# Patient Record
Sex: Female | Born: 1975
Health system: Southern US, Community
[De-identification: ages and names within clinical notes are randomized; demographics above are authoritative.]

## PROBLEM LIST (undated history)

## (undated) DIAGNOSIS — D1800 Hemangioma unspecified site: Secondary | ICD-10-CM

## (undated) DIAGNOSIS — Q2112 Patent foramen ovale: Secondary | ICD-10-CM

## (undated) DIAGNOSIS — T7840XA Allergy, unspecified, initial encounter: Secondary | ICD-10-CM

## (undated) HISTORY — PX: MOUTH SURGERY: SHX715

## (undated) HISTORY — DX: Allergy, unspecified, initial encounter: T78.40XA

## (undated) HISTORY — DX: Hemangioma unspecified site: D18.00

## (undated) HISTORY — DX: Patent foramen ovale: Q21.12

---

## 2006-02-15 ENCOUNTER — Encounter: Admission: RE | Admit: 2006-02-15 | Discharge: 2006-02-15 | Payer: Self-pay | Admitting: Interventional Radiology

## 2006-03-03 ENCOUNTER — Emergency Department (HOSPITAL_COMMUNITY): Admission: EM | Admit: 2006-03-03 | Discharge: 2006-03-03 | Payer: Self-pay | Admitting: Emergency Medicine

## 2008-05-09 ENCOUNTER — Inpatient Hospital Stay (HOSPITAL_COMMUNITY): Admission: AD | Admit: 2008-05-09 | Discharge: 2008-05-12 | Payer: Self-pay | Admitting: Obstetrics and Gynecology

## 2010-09-10 ENCOUNTER — Ambulatory Visit (HOSPITAL_COMMUNITY)
Admission: RE | Admit: 2010-09-10 | Discharge: 2010-09-10 | Payer: Self-pay | Source: Home / Self Care | Attending: Obstetrics and Gynecology | Admitting: Obstetrics and Gynecology

## 2010-09-13 LAB — CBC
HCT: 41.4 % (ref 36.0–46.0)
Hemoglobin: 14.2 g/dL (ref 12.0–15.0)
MCH: 29.8 pg (ref 26.0–34.0)
MCHC: 34.3 g/dL (ref 30.0–36.0)
MCV: 86.8 fL (ref 78.0–100.0)
Platelets: 227 10*3/uL (ref 150–400)
RBC: 4.77 MIL/uL (ref 3.87–5.11)
RDW: 12.5 % (ref 11.5–15.5)
WBC: 4.3 10*3/uL (ref 4.0–10.5)

## 2010-09-17 NOTE — Op Note (Signed)
  NAME:  Linda Wolfe, Linda Wolfe              ACCOUNT NO.:  [id number]  MEDICAL RECORD NO.:  [id number]          PATIENT TYPE:  AMB  LOCATION:  SDC                           FACILITY:  [facility]  PHYSICIAN:  Dineen Kid. Rana Snare, M.D.    DATE OF BIRTH:  09-17-1975  DATE OF PROCEDURE:  09/10/2010 DATE OF DISCHARGE:                              OPERATIVE REPORT   PREOPERATIVE DIAGNOSIS:  Embryonic demise at 59 weeks' gestational age.  POSTOPERATIVE DIAGNOSIS:  Embryonic demise at 69 weeks' gestational age.  PROCEDURE:  Dilation and evacuation.  SURGEON:  Dineen Kid. Rana Snare, MD  ANESTHESIA:  Monitored anesthetic care and paracervical block.  INDICATIONS:  [name] is a 35 year old, G3, P1, who presented to the office last week with size less than dates gestational sac.  Followup ultrasound yesterday shows embryonic demise 8-week size.  She desires dilation and evacuation.  Blood type is A positive.  Risks and benefits were discussed.  Informed consent was obtained.  DESCRIPTION OF PROCEDURE:  After adequate analgesia, the patient placed in the dorsal lithotomy position.  She was sterilely prepped and draped. The bladder was sterilely drained.  Graves speculum was placed. Tenaculum placed in the anterior lip of the cervix.  Paracervical block was placed with 1% Xylocaine 1:100,000 epinephrine, total of 20 mL used. Uterus sounded to 9-cm, easily dilated to #29 [facility] dilator.  A 8-mm suction curette was inserted.  Suction curettage were performed retrieving products of conception.  This was performed to the gritty surface and felt throughout the endometrial cavity.  The patient was given Methergine 0.2 mg IM with good uterine response.  No further products are being retrieved.  The curette was then removed.  Tenaculum removed from the anterior lip of the cervix.  The cervix noted to be hemostatic.  The patient was then transferred to the recovery room in stable condition.  The patient received 1 g of Ancef  preoperatively, 0.2 mg of Methergine intraoperatively and Toradol 30 mg IV postoperatively.  DISPOSITION:  The patient will be discharged to home and will follow up in the office in 2-3 weeks.  Sent home with routine instruction sheet for D and E.  Told to return for increased pain, fever or bleeding.  She does sign up the prescription for Methergine 0.2 mg to take every 8 h. for 2 days.     Dineen Kid Rana Snare, M.D.    DCL/MEDQ  D:  09/10/2010  T:  09/11/2010  Job:  [id number]  Electronically Signed by Candice Camp M.D. on 09/16/2010 11:15:04 PM

## 2011-01-11 NOTE — Op Note (Signed)
NAME:  Linda Wolfe, Linda Wolfe              ACCOUNT NO.:  [id number]   MEDICAL RECORD NO.:  [id number]          PATIENT TYPE:  INP   LOCATION:  9117                          FACILITY:  [facility]   PHYSICIAN:  Dineen Kid. Rana Snare, M.D.    DATE OF BIRTH:  Nov 11, 1975   DATE OF PROCEDURE:  05/09/2008  DATE OF DISCHARGE:                               OPERATIVE REPORT   PREOPERATIVE DIAGNOSES:  Intrauterine pregnancy at 36-1/2 weeks'  gestational age, breech labor, and nonreassuring fetal heart tracing.   POSTOPERATIVE DIAGNOSES:  Intrauterine pregnancy at 36-1/2 weeks'  gestational age, breech labor, and nonreassuring fetal heart tracing.   PROCEDURE:  Primary low-segment transverse cesarean section.   SURGEON:  Dineen Kid. Rana Snare, MD   ANESTHESIA:  Spinal.   INDICATIONS:  [name] is a 35 year old G1 who presented to the  hospital with ongoing abdominal pelvic pain and contractions, had a  difficult night last night.  Because of contractions, she has known  breech presentation.  She also has a question of rupture of membranes.  On physical exam, we had noted that she was not ruptured.  She was  completely effaced, 1 cm dilated with a -1 station of the buttocks.  While monitoring, she was contracting every 2 to 4 minutes.  She did  have a prolonged heart rate deceleration to 80 beats a minute for about  a minute and half, which responded a position change and fluids.  Because of early labor and also the D cell, we planned to proceed with  primary low-segment transverse cesarean section.  The risks and benefits  were discussed and informed consent was obtained.   FINDINGS:  At the time of surgery, viable female infant, Apgars were 9  and 9, pH arterial 7.30, and the weight was 6 pounds 0 ounces.   DESCRIPTION OF PROCEDURE:  After adequate analgesia, the patient was  placed in the supine position with left lateral tilt.  She was sterilely  prepped and draped.  The bladder was sterilely drained with a  Foley  catheter.  A Pfannenstiel skin incision was made 2 fingerbreadths above  the pubic symphysis, which was taken down sharply to fascia and incised  transversely, extended superiorly and inferiorly off the bellies of  rectus muscle, which were separated sharply in midline.  Peritoneum was  entered sharply.  Bladder flap created and were placed behind the  bladder blade.  A low-segment myotomy incision was made down to the  amniotic sac and extended laterally with an operator's fingertips.  The  infant's buttocks was delivered atraumatically.  The legs were reduced  and arms were easily reduced.  The head was delivered atraumatically.  The nares and pharynx were then suctioned.  Cord clamped, cut, and  handed to the pediatricians for resuscitation.  Cord blood was then  obtained.  Placenta was extracted manually.  The uterus was exteriorized  and wiped clean with a dry lap.  The myotomy incision was closed in 2  layers, first being a running locking layer and the second being the  imbricating layer of 0 Monocryl suture.  The uterus was  then placed back  in the peritoneal cavity, and after copious amount of irrigation and  adequate hemostasis was assured, the peritoneum was closed with 0  Monocryl suture.  Rectus muscle was plicated in midline.  The fascia  was then closed with a #1 Vicryl in running fashion.  Irrigation was  applied.  After adequate hemostasis, skin staples and Steri-Strips were  applied.  The patient tolerated the procedure well, was stable on  transfer to recovery room.  Sponge and instrument counts were normal x3.  Estimated blood loss was 500 mL.      Dineen Kid Rana Snare, M.D.  Electronically Signed     DCL/MEDQ  D:  05/09/2008  T:  05/10/2008  Job:  [id number]

## 2011-01-11 NOTE — Discharge Summary (Signed)
Linda Wolfe, WHITMORE NO.:  [id number]   MEDICAL RECORD NO.:  [id number]          PATIENT TYPE:  INP   LOCATION:  9117                          [facility]ILITY:  [facility]   PHYSICIAN:  Zelphia Cairo, MD    DATE OF BIRTH:  [birth year]   DATE OF ADMISSION:  05/09/2008  DATE OF DISCHARGE:  05/12/2008                               DISCHARGE SUMMARY   ADMITTING DIAGNOSES:  1. Intrauterine pregnancy at 36-1/2 weeks' estimated gestational age.  2. Spontaneous onset of labor.  3. Breech presentation.   DISCHARGE DIAGNOSES:  1. Status post low transverse cesarean section.  2. Viable female infant.   PROCEDURE:  Primary low transverse cesarean section.   REASON FOR ADMISSION:  Please see dictated H&P.   HOSPITAL COURSE:  The patient is a 35 year old primigravida who was  admitted to [facility] at 36-1/2 weeks' estimated  gestational age with spontaneous early onset of labor.  While the  patient was being observed, the patient was also noted to have a  prolonged deceleration down to 80 beats per minute with spontaneous  recovery.  The patient was also known to have a breech presentation.  Due to the non-reassuring fetal heart tones and breech presentation,  decision was made to proceed with a primary low transverse cesarean  section.  The patient was then transferred to the operating room where  spinal anesthesia was administered without difficulty.  A low transverse  incision was made, with delivery of a viable female infant with Apgars  of  9 at one minute and 9 at five minutes.  Arterial cord pH was 7.30.  The patient tolerated the procedure well and was taken to the recovery  room in stable condition.  On postoperative day #1, the patient was  without complaint.  Vital signs were stable.  She was afebrile.  Abdomen  was soft.  Fundus firm and nontender.  Abdominal dressing was noted to  be clean, dry, and intact.  Laboratory findings showed hemoglobin of  10.6.  On postoperative day #2, the patient was without complaint.  Vital signs were stable.  She was afebrile.  Fundus firm and nontender.  Abdominal dressing had been removed revealing an incision that was  clean, dry, and intact.  On postoperative day #3, the patient continued  to be without complaint.  Vital signs were stable.  She was afebrile.  Fundus firm and nontender.  Incision was clean, dry, and intact.  The  staples were removed.  Discharge instructions were reviewed, and the  patient was later discharged home.   CONDITION ON DISCHARGE:  Stable.   DIET:  Regular as tolerated.   ACTIVITY:  No heavy lifting, no driving x2 weeks, and no vaginal entry.   FOLLOWUP:  The patient is to follow up in the office in 1-2 weeks for an  incision check.  She is to call for temperature greater than 100  degrees, persistent nausea, vomiting, heavy vaginal bleeding, and/or  redness or drainage from the incisional site.   DISCHARGE MEDICATIONS:  1. Percocet 5/325, #30, 1 p.o. q.4-6 h. p.r.n.  2. Motrin 600  mg every 6 hours.  3. Prenatal vitamins 1 p.o. daily.  4. Colace 1 p.o. daily p.r.n.      Julio Sicks, N.P.      Zelphia Cairo, MD  Electronically Signed    CC/MEDQ  D:  05/12/2008  T:  05/12/2008  Job:  [id number]

## 2011-01-25 LAB — ABO/RH: RH Type: POSITIVE

## 2011-01-25 LAB — HEPATITIS B SURFACE ANTIGEN: Hepatitis B Surface Ag: NEGATIVE

## 2011-06-01 LAB — RPR: RPR Ser Ql: NONREACTIVE

## 2011-06-01 LAB — CBC
HCT: 31.4 — ABNORMAL LOW
HCT: 38.4
Hemoglobin: 10.6 — ABNORMAL LOW
Hemoglobin: 13.1
MCHC: 33.7
MCHC: 34
MCV: 90
MCV: 91.8
Platelets: 191
Platelets: 234
RBC: 3.42 — ABNORMAL LOW
RBC: 4.27
RDW: 13.2
RDW: 13.4
WBC: 11 — ABNORMAL HIGH
WBC: 9.2

## 2011-07-16 ENCOUNTER — Encounter (HOSPITAL_COMMUNITY): Payer: Self-pay | Admitting: Anesthesiology

## 2011-07-16 ENCOUNTER — Encounter (HOSPITAL_COMMUNITY): Payer: Self-pay | Admitting: *Deleted

## 2011-07-16 ENCOUNTER — Other Ambulatory Visit: Payer: Self-pay | Admitting: Obstetrics and Gynecology

## 2011-07-16 ENCOUNTER — Inpatient Hospital Stay (HOSPITAL_COMMUNITY)
Admission: AD | Admit: 2011-07-16 | Discharge: 2011-07-18 | DRG: 371 | Disposition: A | Discharge status: Home / Self Care | Payer: BC Managed Care – PPO | Source: Ambulatory Visit | Attending: Obstetrics and Gynecology | Admitting: Obstetrics and Gynecology

## 2011-07-16 ENCOUNTER — Encounter (HOSPITAL_COMMUNITY)
Admission: AD | Disposition: A | Discharge status: Home / Self Care | Payer: Self-pay | Source: Ambulatory Visit | Attending: Obstetrics and Gynecology

## 2011-07-16 ENCOUNTER — Inpatient Hospital Stay (HOSPITAL_COMMUNITY): Payer: BC Managed Care – PPO | Admitting: Anesthesiology

## 2011-07-16 DIAGNOSIS — Z302 Encounter for sterilization: Secondary | ICD-10-CM

## 2011-07-16 DIAGNOSIS — O34219 Maternal care for unspecified type scar from previous cesarean delivery: Principal | ICD-10-CM | POA: Diagnosis present

## 2011-07-16 DIAGNOSIS — O09529 Supervision of elderly multigravida, unspecified trimester: Secondary | ICD-10-CM | POA: Diagnosis present

## 2011-07-16 LAB — CBC
HCT: 37.3 % (ref 36.0–46.0)
Hemoglobin: 12.7 g/dL (ref 12.0–15.0)
MCH: 30.8 pg (ref 26.0–34.0)
MCHC: 34 g/dL (ref 30.0–36.0)
MCV: 90.3 fL (ref 78.0–100.0)
Platelets: 175 10*3/uL (ref 150–400)
RBC: 4.13 MIL/uL (ref 3.87–5.11)
RDW: 13.4 % (ref 11.5–15.5)
WBC: 8.8 10*3/uL (ref 4.0–10.5)

## 2011-07-16 LAB — RPR: RPR Ser Ql: NONREACTIVE

## 2011-07-16 LAB — POCT FERN TEST: Fern Test: POSITIVE

## 2011-07-16 SURGERY — Surgical Case
Anesthesia: Regional | Site: Abdomen | Wound class: Clean Contaminated

## 2011-07-16 MED ORDER — FLEET ENEMA 7-19 GM/118ML RE ENEM: 1.0000 | ENEMA | RECTAL | Status: DC | PRN

## 2011-07-16 MED ORDER — CITRIC ACID-SODIUM CITRATE 334-500 MG/5ML PO SOLN: 30.0000 mL | Freq: Once | ORAL | Status: DC

## 2011-07-16 MED ORDER — CITRIC ACID-SODIUM CITRATE 334-500 MG/5ML PO SOLN
ORAL | Status: AC
  Administered 2011-07-16: 30 mL via ORAL
  Filled 2011-07-16: qty 15

## 2011-07-16 MED ORDER — ONDANSETRON HCL 4 MG/2ML IJ SOLN: 4.0000 mg | INTRAMUSCULAR | Status: DC | PRN

## 2011-07-16 MED ORDER — LACTATED RINGERS IV SOLN
INTRAVENOUS | Status: DC
  Administered 2011-07-16: 100 mL via INTRAVENOUS
  Administered 2011-07-16 (×2): via INTRAVENOUS

## 2011-07-16 MED ORDER — KETOROLAC TROMETHAMINE 30 MG/ML IJ SOLN
30.0000 mg | Freq: Four times a day (QID) | INTRAMUSCULAR | Status: AC | PRN
  Administered 2011-07-16 – 2011-07-17 (×3): 30 mg via INTRAVENOUS
  Filled 2011-07-16 (×2): qty 1

## 2011-07-16 MED ORDER — EPHEDRINE 5 MG/ML INJ
INTRAVENOUS | Status: AC
  Filled 2011-07-16: qty 10

## 2011-07-16 MED ORDER — LANOLIN HYDROUS EX OINT: 1.0000 "application " | TOPICAL_OINTMENT | CUTANEOUS | Status: DC | PRN

## 2011-07-16 MED ORDER — KETOROLAC TROMETHAMINE 30 MG/ML IJ SOLN
INTRAMUSCULAR | Status: AC
  Administered 2011-07-16: 30 mg via INTRAVENOUS
  Filled 2011-07-16: qty 1

## 2011-07-16 MED ORDER — SENNOSIDES-DOCUSATE SODIUM 8.6-50 MG PO TABS
2.0000 | ORAL_TABLET | Freq: Every day | ORAL | Status: DC
  Administered 2011-07-17: 2 via ORAL

## 2011-07-16 MED ORDER — FENTANYL CITRATE 0.05 MG/ML IJ SOLN: 25.0000 ug | INTRAMUSCULAR | Status: DC | PRN

## 2011-07-16 MED ORDER — OXYTOCIN 10 UNIT/ML IJ SOLN
INTRAMUSCULAR | Status: DC | PRN
  Administered 2011-07-16: 20 [IU] via INTRAMUSCULAR

## 2011-07-16 MED ORDER — ZOLPIDEM TARTRATE 5 MG PO TABS: 5.0000 mg | ORAL_TABLET | Freq: Every evening | ORAL | Status: DC | PRN

## 2011-07-16 MED ORDER — ONDANSETRON HCL 4 MG/2ML IJ SOLN
INTRAMUSCULAR | Status: DC | PRN
  Administered 2011-07-16: 4 mg via INTRAVENOUS

## 2011-07-16 MED ORDER — FENTANYL CITRATE 0.05 MG/ML IJ SOLN
INTRAMUSCULAR | Status: AC
  Filled 2011-07-16: qty 2

## 2011-07-16 MED ORDER — DIPHENHYDRAMINE HCL 50 MG/ML IJ SOLN: 25.0000 mg | INTRAMUSCULAR | Status: DC | PRN

## 2011-07-16 MED ORDER — OXYTOCIN 20 UNITS IN LACTATED RINGERS INFUSION - SIMPLE
125.0000 mL/h | INTRAVENOUS | Status: AC
  Administered 2011-07-16: 125 mL/h via INTRAVENOUS

## 2011-07-16 MED ORDER — SIMETHICONE 80 MG PO CHEW: 80.0000 mg | CHEWABLE_TABLET | ORAL | Status: DC | PRN

## 2011-07-16 MED ORDER — DIPHENHYDRAMINE HCL 50 MG/ML IJ SOLN: 12.5000 mg | INTRAMUSCULAR | Status: DC | PRN

## 2011-07-16 MED ORDER — DIPHENHYDRAMINE HCL 25 MG PO CAPS: 25.0000 mg | ORAL_CAPSULE | Freq: Four times a day (QID) | ORAL | Status: DC | PRN

## 2011-07-16 MED ORDER — FENTANYL CITRATE 0.05 MG/ML IJ SOLN
INTRAMUSCULAR | Status: DC | PRN
  Administered 2011-07-16: 20 ug via INTRATHECAL

## 2011-07-16 MED ORDER — BUPIVACAINE IN DEXTROSE 0.75-8.25 % IT SOLN
INTRATHECAL | Status: DC | PRN
  Administered 2011-07-16: 2 mL via INTRATHECAL

## 2011-07-16 MED ORDER — SODIUM CHLORIDE 0.9 % IJ SOLN: 3.0000 mL | INTRAMUSCULAR | Status: DC | PRN

## 2011-07-16 MED ORDER — MORPHINE SULFATE 0.5 MG/ML IJ SOLN
INTRAMUSCULAR | Status: AC
  Filled 2011-07-16: qty 10

## 2011-07-16 MED ORDER — NALBUPHINE HCL 10 MG/ML IJ SOLN: 5.0000 mg | INTRAMUSCULAR | Status: DC | PRN

## 2011-07-16 MED ORDER — MEASLES, MUMPS & RUBELLA VAC ~~LOC~~ INJ: 0.5000 mL | INJECTION | Freq: Once | SUBCUTANEOUS | Status: DC

## 2011-07-16 MED ORDER — PRENATAL PLUS 27-1 MG PO TABS
1.0000 | ORAL_TABLET | Freq: Every day | ORAL | Status: DC
  Administered 2011-07-17 (×2): 1 via ORAL
  Filled 2011-07-16: qty 1

## 2011-07-16 MED ORDER — CEFAZOLIN SODIUM 1-5 GM-% IV SOLN
INTRAVENOUS | Status: AC
  Administered 2011-07-16: 1 g via INTRAVENOUS
  Filled 2011-07-16: qty 50

## 2011-07-16 MED ORDER — SODIUM CHLORIDE 0.9 % IJ SOLN: 3.0000 mL | Freq: Two times a day (BID) | INTRAMUSCULAR | Status: DC

## 2011-07-16 MED ORDER — IBUPROFEN 800 MG PO TABS
800.0000 mg | ORAL_TABLET | Freq: Three times a day (TID) | ORAL | Status: DC | PRN
  Administered 2011-07-17 – 2011-07-18 (×4): 800 mg via ORAL
  Filled 2011-07-16 (×5): qty 1

## 2011-07-16 MED ORDER — TETANUS-DIPHTH-ACELL PERTUSSIS 5-2.5-18.5 LF-MCG/0.5 IM SUSP: 0.5000 mL | Freq: Once | INTRAMUSCULAR | Status: DC

## 2011-07-16 MED ORDER — WITCH HAZEL-GLYCERIN EX PADS: 1.0000 "application " | MEDICATED_PAD | CUTANEOUS | Status: DC | PRN

## 2011-07-16 MED ORDER — METOCLOPRAMIDE HCL 5 MG/ML IJ SOLN
10.0000 mg | Freq: Once | INTRAMUSCULAR | Status: AC | PRN
  Administered 2011-07-16: 10 mg via INTRAVENOUS

## 2011-07-16 MED ORDER — MORPHINE SULFATE (PF) 0.5 MG/ML IJ SOLN
INTRAMUSCULAR | Status: DC | PRN
  Administered 2011-07-16: .1 mg via INTRATHECAL

## 2011-07-16 MED ORDER — SCOPOLAMINE 1 MG/3DAYS TD PT72
MEDICATED_PATCH | TRANSDERMAL | Status: AC
  Administered 2011-07-16: 1 via TRANSDERMAL
  Filled 2011-07-16: qty 1

## 2011-07-16 MED ORDER — METOCLOPRAMIDE HCL 5 MG/ML IJ SOLN
INTRAMUSCULAR | Status: AC
  Administered 2011-07-16: 10 mg via INTRAVENOUS
  Filled 2011-07-16: qty 2

## 2011-07-16 MED ORDER — CEFAZOLIN SODIUM 1-5 GM-% IV SOLN
1.0000 g | INTRAVENOUS | Status: AC
  Administered 2011-07-16: 1 g via INTRAVENOUS

## 2011-07-16 MED ORDER — SIMETHICONE 80 MG PO CHEW
80.0000 mg | CHEWABLE_TABLET | Freq: Three times a day (TID) | ORAL | Status: DC
  Administered 2011-07-17 (×2): 80 mg via ORAL

## 2011-07-16 MED ORDER — MEPERIDINE HCL 25 MG/ML IJ SOLN: 6.2500 mg | INTRAMUSCULAR | Status: DC | PRN

## 2011-07-16 MED ORDER — SODIUM CHLORIDE 0.9 % IR SOLN
Status: DC | PRN
  Administered 2011-07-16: 1000 mL

## 2011-07-16 MED ORDER — NALOXONE HCL 0.4 MG/ML IJ SOLN: 0.4000 mg | INTRAMUSCULAR | Status: DC | PRN

## 2011-07-16 MED ORDER — FAMOTIDINE IN NACL 20-0.9 MG/50ML-% IV SOLN: 20.0000 mg | Freq: Once | INTRAVENOUS | Status: DC

## 2011-07-16 MED ORDER — CITRIC ACID-SODIUM CITRATE 334-500 MG/5ML PO SOLN
30.0000 mL | Freq: Once | ORAL | Status: AC
  Administered 2011-07-16: 30 mL via ORAL

## 2011-07-16 MED ORDER — SODIUM CHLORIDE 0.9 % IV SOLN: 1.0000 ug/kg/h | INTRAVENOUS | Status: DC | PRN

## 2011-07-16 MED ORDER — MENTHOL 3 MG MT LOZG: 1.0000 | LOZENGE | OROMUCOSAL | Status: DC | PRN

## 2011-07-16 MED ORDER — PROPRANOLOL HCL 10 MG PO TABS
10.0000 mg | ORAL_TABLET | Freq: Every day | ORAL | Status: DC
  Administered 2011-07-16: 10 mg via ORAL
  Administered 2011-07-17: 22:00:00 via ORAL
  Filled 2011-07-16 (×2): qty 1

## 2011-07-16 MED ORDER — SCOPOLAMINE 1 MG/3DAYS TD PT72: 1.0000 | MEDICATED_PATCH | TRANSDERMAL | Status: DC

## 2011-07-16 MED ORDER — ONDANSETRON HCL 4 MG/2ML IJ SOLN
INTRAMUSCULAR | Status: AC
  Filled 2011-07-16: qty 2

## 2011-07-16 MED ORDER — BISACODYL 10 MG RE SUPP: 10.0000 mg | Freq: Every day | RECTAL | Status: DC | PRN

## 2011-07-16 MED ORDER — SCOPOLAMINE 1 MG/3DAYS TD PT72: 1.0000 | MEDICATED_PATCH | Freq: Once | TRANSDERMAL | Status: DC

## 2011-07-16 MED ORDER — SODIUM CHLORIDE 0.9 % IV SOLN: 250.0000 mL | INTRAVENOUS | Status: DC

## 2011-07-16 MED ORDER — OXYTOCIN 20 UNITS IN LACTATED RINGERS INFUSION - SIMPLE
INTRAVENOUS | Status: AC
  Administered 2011-07-16: 125 mL/h via INTRAVENOUS
  Filled 2011-07-16: qty 1000

## 2011-07-16 MED ORDER — OXYTOCIN 10 UNIT/ML IJ SOLN
INTRAMUSCULAR | Status: AC
  Filled 2011-07-16: qty 2

## 2011-07-16 MED ORDER — DIPHENHYDRAMINE HCL 25 MG PO CAPS: 25.0000 mg | ORAL_CAPSULE | ORAL | Status: DC | PRN

## 2011-07-16 MED ORDER — KETOROLAC TROMETHAMINE 30 MG/ML IJ SOLN: 30.0000 mg | Freq: Four times a day (QID) | INTRAMUSCULAR | Status: AC | PRN

## 2011-07-16 MED ORDER — OXYCODONE-ACETAMINOPHEN 5-325 MG PO TABS: 1.0000 | ORAL_TABLET | Freq: Four times a day (QID) | ORAL | Status: DC | PRN

## 2011-07-16 MED ORDER — METOCLOPRAMIDE HCL 5 MG/ML IJ SOLN: 10.0000 mg | Freq: Three times a day (TID) | INTRAMUSCULAR | Status: DC | PRN

## 2011-07-16 MED ORDER — ONDANSETRON HCL 4 MG/2ML IJ SOLN: 4.0000 mg | Freq: Three times a day (TID) | INTRAMUSCULAR | Status: DC | PRN

## 2011-07-16 MED ORDER — IBUPROFEN 600 MG PO TABS: 600.0000 mg | ORAL_TABLET | Freq: Four times a day (QID) | ORAL | Status: DC | PRN

## 2011-07-16 MED ORDER — DIBUCAINE 1 % RE OINT: 1.0000 "application " | TOPICAL_OINTMENT | RECTAL | Status: DC | PRN

## 2011-07-16 SURGICAL SUPPLY — 26 items
CLIP FILSHIE TUBAL LIGA STRL (Clip) ×2 IMPLANT
CLOTH BEACON ORANGE TIMEOUT ST (SAFETY) ×2 IMPLANT
DRESSING TELFA 8X3 (GAUZE/BANDAGES/DRESSINGS) IMPLANT
DRSG COVADERM 4X10 (GAUZE/BANDAGES/DRESSINGS) ×2 IMPLANT
DRSG PAD ABDOMINAL 8X10 ST (GAUZE/BANDAGES/DRESSINGS) ×2 IMPLANT
ELECT REM PT RETURN 9FT ADLT (ELECTROSURGICAL) ×2
ELECTRODE REM PT RTRN 9FT ADLT (ELECTROSURGICAL) ×1 IMPLANT
EXTRACTOR VACUUM M CUP 4 TUBE (SUCTIONS) IMPLANT
GAUZE SPONGE 4X4 12PLY STRL LF (GAUZE/BANDAGES/DRESSINGS) ×2 IMPLANT
GLOVE BIO SURGEON STRL SZ7 (GLOVE) ×4 IMPLANT
GOWN PREVENTION PLUS LG XLONG (DISPOSABLE) ×4 IMPLANT
KIT ABG SYR 3ML LUER SLIP (SYRINGE) IMPLANT
NEEDLE HYPO 25X5/8 SAFETYGLIDE (NEEDLE) IMPLANT
NS IRRIG 1000ML POUR BTL (IV SOLUTION) ×2 IMPLANT
PACK C SECTION WH (CUSTOM PROCEDURE TRAY) ×2 IMPLANT
PAD ABD 7.5X8 STRL (GAUZE/BANDAGES/DRESSINGS) ×2 IMPLANT
SLEEVE SCD COMPRESS KNEE MED (MISCELLANEOUS) ×2 IMPLANT
SUT CHROMIC 0 CTX 36 (SUTURE) ×6 IMPLANT
SUT MON AB 4-0 PS1 27 (SUTURE) ×2 IMPLANT
SUT PDS AB 0 CT1 27 (SUTURE) ×4 IMPLANT
SUT VIC AB 3-0 CT1 27 (SUTURE) ×2
SUT VIC AB 3-0 CT1 TAPERPNT 27 (SUTURE) ×2 IMPLANT
TAPE CLOTH SURG 4X10 WHT LF (GAUZE/BANDAGES/DRESSINGS) ×2 IMPLANT
TOWEL OR 17X24 6PK STRL BLUE (TOWEL DISPOSABLE) ×4 IMPLANT
TRAY FOLEY CATH 14FR (SET/KITS/TRAYS/PACK) ×2 IMPLANT
WATER STERILE IRR 1000ML POUR (IV SOLUTION) ×2 IMPLANT

## 2011-07-16 NOTE — Anesthesia Procedure Notes (Addendum)
Spinal  Patient location during procedure: OR Start time: 07/16/2011 11:17 AM Staffing Anesthesiologist: FOSTER, MICHAEL A. Performed by: anesthesiologist  Preanesthetic Checklist Completed: patient identified, site marked, surgical consent, pre-op evaluation, timeout performed, IV checked, risks and benefits discussed and monitors and equipment checked Spinal Block Patient position: sitting Prep: site prepped and draped and DuraPrep Patient monitoring: heart rate, cardiac monitor, continuous pulse ox and blood pressure Approach: midline Location: L3-4 Injection technique: single-shot Needle Needle type: Sprotte  Needle gauge: 24 G Needle length: 9 cm Needle insertion depth: 4 cm Assessment Sensory level: T4 Additional Notes Patient tolerated procedure well. Sensory level adequate.

## 2011-07-16 NOTE — Progress Notes (Signed)
Pt reports having a  Heavy mucusy discharge since yesterday that has gotten more "water" like. Reports mild  occasional cramping.Marland Kitchen

## 2011-07-16 NOTE — Anesthesia Postprocedure Evaluation (Signed)
  Anesthesia Post-op Note  Patient: Linda Wolfe  Procedure(s) Performed:  CESAREAN SECTION - Repeat cesarean section with delivery of baby boy at 56. apgars 8/9. Bilateral tubal ligation with filshie clips.  Patient Location: PACU  Anesthesia Type: Spinal  Level of Consciousness: awake, alert  and oriented  Airway and Oxygen Therapy: Patient Spontanous Breathing  Post-op Pain: none  Post-op Assessment: Post-op Vital signs reviewed, Patient's Cardiovascular Status Stable, Respiratory Function Stable, Patent Airway, No signs of Nausea or vomiting, Pain level controlled, No headache and No backache  Post-op Vital Signs: Reviewed and stable  Complications: No apparent anesthesia complications

## 2011-07-16 NOTE — H&P (Signed)
NAVA SONG is a 35 y.o. female presenting for SROM + labor, sched for RCS, declines TOL. Maternal Medical History:  Reason for admission: Reason for admission: rupture of membranes and contractions.  Contractions: Onset was 3-5 hours ago.   Frequency: irregular.   Perceived severity is mild.    Fetal activity: Perceived fetal activity is normal.      OB History    Grav Para Term Preterm Abortions TAB SAB Ect Mult Living   4 1 0 1 2 0 2 0 0 1      Past Medical History  Diagnosis Date  . Migraine    Past Surgical History  Procedure Date  . Cesarean section   . Mouth surgery    Family History: family history is not on file. Social History:  reports that she has never smoked. She does not have any smokeless tobacco history on file. She reports that she does not drink alcohol or use illicit drugs.  ROS    Blood pressure 123/73, pulse 73, temperature 98.5 F (36.9 C), temperature source Oral, resp. rate 18, height 5' 9.5" (1.765 m), weight 74.299 kg (163 lb 12.8 oz). Maternal Exam:  Uterine Assessment: Contraction strength is mild.  Contraction frequency is regular.   Abdomen: Patient reports no abdominal tenderness. Surgical scars: low transverse.   Fundal height is 35 cm.   Estimated fetal weight is AGA.   Fetal presentation: vertex     Physical Exam  Constitutional: She appears well-developed and well-nourished.  HENT:  Head: Normocephalic and atraumatic.  Neck: Normal range of motion. Neck supple.  Cardiovascular: Normal rate and regular rhythm.   Respiratory: Effort normal and breath sounds normal.  GI:       35 cm FH, FHR 146  Genitourinary:       SSE>>+ pool, fern+ nitr+, cx ~ 1 cm    Prenatal labs: ABO, Rh:   Antibody:   Rubella:   RPR:    HBsAg:    HIV:    GBS:     Assessment/Plan: Prior CS @ 35 weeks w/ SROM + labor, declines VBAC.Risks of bleeding, infxn, transfusion reviewed.  Filshie tubal permanence + failure rate of 2-10/998 reviewed. Pt  + husband agree for [facility] 07/16/2011, 10:12 AM

## 2011-07-16 NOTE — Progress Notes (Signed)
Pt presents to MAU with chief complaint of ? ROM. Pt states she noticed a mucous discharge yesterday morning. Pt states throughout the night she noticed her panties more wet. Pt is a G2P1 at [redacted]w[redacted]d. First baby born at 22w6days. No complications, pt is a prior c-section, plans to have section with this baby as well.

## 2011-07-16 NOTE — ED Provider Notes (Signed)
History     Chief Complaint  Patient presents with  . Rupture of Membranes   HPI Linda Wolfe 35 y.o. 35w 0d gestation.  Comes to MAU with leaking of fluid.  Had mucus discharge yesterday and through the night continued to have leaking.  No contractions.  No bleeding.   OB History    Grav Para Term Preterm Abortions TAB SAB Ect Mult Living   4 1 0 1 2 0 2 0 0 1       Past Medical History  Diagnosis Date  . Migraine     Past Surgical History  Procedure Date  . Cesarean section   . Mouth surgery     No family history on file.  History  Substance Use Topics  . Smoking status: Never Smoker   . Smokeless tobacco: Not on file  . Alcohol Use: No    Allergies:  Allergies  Allergen Reactions  . Codeine Nausea And Vomiting    Prescriptions prior to admission  Medication Sig Dispense Refill  . acetaminophen (TYLENOL) 325 MG tablet Take 325 mg by mouth every 6 (six) hours as needed. For headaches.       . prenatal vitamin w/FE, FA (PRENATAL 1 + 1) 27-1 MG TABS Take 1 tablet by mouth daily.        . propranolol (INDERAL) 10 MG tablet Take 10 mg by mouth daily.          Review of Systems  Genitourinary:       Leaking of fluid   Physical Exam   Blood pressure 123/73, pulse 73, temperature 98.5 F (36.9 C), temperature source Oral, resp. rate 18, height 5' 9.5" (1.765 m), weight 163 lb 12.8 oz (74.299 kg).  Physical Exam  Nursing note and vitals reviewed. Constitutional: She is oriented to person, place, and time. She appears well-developed and well-nourished.  HENT:  Head: Normocephalic.  Eyes: EOM are normal.  Neck: Neck supple.  GI: Soft. There is no tenderness.  Genitourinary:       Speculum exam: Vagina - Small amount of pooling with valsalva Cervix - No contact bleeding Bimanual exam deferred Cervix appeared less than 1 cm on speculum exam Fern slide done Chaperone present for exam.  Musculoskeletal: Normal range of motion.  Neurological: She is  alert and oriented to person, place, and time.  Skin: Skin is warm and dry.  Psychiatric: She has a normal mood and affect.    MAU Course  Procedures  MDM Dr. Marcelle Overlie notified of client and will see client in MAU  Assessment and Plan  Rupture of membranes  35 w gestation Previous c-section  Plan Dr. Marcelle Overlie to see client  [facility] 07/16/2011, 9:02 AM   Nolene Bernheim, NP 07/16/11 [phone]

## 2011-07-16 NOTE — Op Note (Signed)
Preoperative diagnosis: 35 week IUP, SROM with early labor, previous cesarean section declines VBAC, request permanent sterilization  Postoperative diagnosis: Same  Procedure: Repeat low transverse cesarean section, tubal ligation by Filshie clip application  Surgeon: Marcelle Overlie  EBL: 800 cc  Specimens removed: Placenta to pathology  Procedure and findings:  Patient taken to the operating room after an adequate level of spinal anesthetic was obtained with the patient in left tilt position the abdomen prepped and draped in the usual manner for cesarean section. The bladder was drained by inserting Foley catheter. Transverse incision made tibial scar which is well-healed this is carried down to the fascia which was incised and extended transversely. Rectus muscles divided in the midline peritoneum entered superiorly without incident and extended in a vertical fashion. The vesicouterine serosa was sharply and bluntly dissected below and the bladder blade repositioned. Transverse incision made in lower segment extended with bandage scissors clear fluid noted the patient then delivered of a healthy female in the vertex presentation infant was suctioned cord clamped and passed the pediatric team for further care in good condition. Placenta was then removed manually intact sent to pathology uterus exteriorized cavity wiped clean with a laparotomy pack closure obtained the first layer of 0 chromic in a locked fashion followed by Dimetane o chromic this is hemostatic tubes and ovaries were normal the bladder flap area was inspected and noted be intact and hemostatic clear urine noted at that point. Filshie clip was applied at each on each tube at a right ankle 2 cm from the cornu with excellent application on either side prior to closure sponge denies precast reported as correct x2 peritoneum closed the running 2-0 Vicryl suture 2-0 Vicryl interrupted sutures used to close the rectus muscles in the midline a 0 PDS  suture from laterally to midline on either side used to close the fascia subcutaneous tissue was hemostatic a 4-0 Monocryl subcuticular skin closure with pressure dressing applied mother and baby doing well at that point baby to regular nursery. Cord pH was sent she did receive preoperative IV Ancef and Pitocin IV after the cord was clamped  Dictated with [employer], not proofread Duke Salvia. Milana Obey.D.

## 2011-07-16 NOTE — Anesthesia Preprocedure Evaluation (Signed)
Anesthesia Evaluation  Patient identified by MRN, date of birth, ID band Patient awake    Reviewed: Allergy & Precautions, H&P , NPO status , Patient's Chart, lab work & pertinent test results  History of Anesthesia Complications (+) PONV  Airway Mallampati: II TM Distance: >3 FB Neck ROM: Full    Dental No notable dental hx. (+) Teeth Intact   Pulmonary neg pulmonary ROS,  clear to auscultation  Pulmonary exam normal       Cardiovascular neg cardio ROS Regular Normal    Neuro/Psych  Headaches, Negative Neurological ROS  Negative Psych ROS   GI/Hepatic negative GI ROS, Neg liver ROS,   Endo/Other  Negative Endocrine ROS  Renal/GU negative Renal ROS  Genitourinary negative   Musculoskeletal   Abdominal   Peds  Hematology negative hematology ROS (+)   Anesthesia Other Findings Had a lot of N/V for 24 hours after 1st C/Section.  Reproductive/Obstetrics (+) Pregnancy                           Anesthesia Physical Anesthesia Plan  ASA: II and Emergent  Anesthesia Plan: Spinal   Post-op Pain Management:    Induction:   Airway Management Planned:   Additional Equipment:   Intra-op Plan:   Post-operative Plan:   Informed Consent: I have reviewed the patients History and Physical, chart, labs and discussed the procedure including the risks, benefits and alternatives for the proposed anesthesia with the patient or authorized representative who has indicated his/her understanding and acceptance.   Dental Advisory Given  Plan Discussed with: Anesthesiologist, CRNA and Surgeon  Anesthesia Plan Comments:         Anesthesia Quick Evaluation

## 2011-07-16 NOTE — ED Notes (Signed)
Dr. Malen Gauze ok'd patients husband to run home due to child left with parents, no car seat. We will proceed to OR as soon as he returns. Per Dr. Roni Bread orders and he will notify the OR staff.

## 2011-07-16 NOTE — Transfer of Care (Signed)
Immediate Anesthesia Transfer of Care Note  Patient: Linda Wolfe  Procedure(s) Performed:  CESAREAN SECTION - Repeat cesarean section with delivery of baby boy at 10. apgars 8/9. Bilateral tubal ligation with filshie clips.  Patient Location: PACU  Anesthesia Type: Spinal  Level of Consciousness: awake, alert  and oriented  Airway & Oxygen Therapy: Patient Spontanous Breathing  Post-op Assessment: Report given to PACU RN and Post -op Vital signs reviewed and stable  Post vital signs: stable  Complications: No apparent anesthesia complications

## 2011-07-17 LAB — CBC
MCH: 31.2 pg (ref 26.0–34.0)
MCHC: 34.2 g/dL (ref 30.0–36.0)
Platelets: 131 10*3/uL — ABNORMAL LOW (ref 150–400)
RBC: 3.24 MIL/uL — ABNORMAL LOW (ref 3.87–5.11)

## 2011-07-17 MED ORDER — ACETAMINOPHEN 500 MG PO TABS
1000.0000 mg | ORAL_TABLET | Freq: Three times a day (TID) | ORAL | Status: DC | PRN
  Administered 2011-07-17 – 2011-07-18 (×4): 1000 mg via ORAL
  Filled 2011-07-17 (×4): qty 2

## 2011-07-17 NOTE — Progress Notes (Signed)
Subjective: Postpartum Day 1: Cesarean Delivery Patient reports tolerating PO.    Objective: Vital signs in last 24 hours: Temp:  [97.3 F (36.3 C)-98.5 F (36.9 C)] 98.1 F (36.7 C) (11/18 0830) Pulse Rate:  [64-76] 76  (11/18 0830) Resp:  [16-20] 18  (11/18 0830) BP: (98-135)/(63-90) 100/66 mmHg (11/18 0830) SpO2:  [93 %-99 %] 95 % (11/18 0830)  Physical Exam:  General: alert Lochia: appropriate Uterine Fundus: firm Incision: healing well DVT Evaluation: No evidence of DVT seen on physical exam.   Basename 07/17/11 0529 07/16/11 1025  HGB 10.1* 12.7  HCT 29.5* 37.3    Assessment/Plan: Status post Cesarean section. Doing well postoperatively.  Continue current care.  Meriel Pica 07/17/2011, 9:35 AM

## 2011-07-17 NOTE — Addendum Note (Signed)
Addendum  created 07/17/11 1610 by Quin Hoop Noorah Giammona   Modules edited:Notes Section

## 2011-07-17 NOTE — Anesthesia Postprocedure Evaluation (Signed)
  Anesthesia Post-op Note  Patient: Linda Wolfe  Procedure(s) Performed:  CESAREAN SECTION - Repeat cesarean section with delivery of baby boy at 58. apgars 8/9. Bilateral tubal ligation with filshie clips.  Patient Location: Mother/Baby  Anesthesia Type: Spinal  Level of Consciousness: awake, alert  and oriented  Airway and Oxygen Therapy: Patient Spontanous Breathing  Post-op Pain: mild  Post-op Assessment: Patient's Cardiovascular Status Stable, Respiratory Function Stable, Patent Airway, No signs of Nausea or vomiting and Pain level controlled  Post-op Vital Signs: stable  Complications: No apparent anesthesia complications

## 2011-07-18 ENCOUNTER — Encounter (HOSPITAL_COMMUNITY): Payer: Self-pay | Admitting: Obstetrics and Gynecology

## 2011-07-18 MED ORDER — IBUPROFEN 800 MG PO TABS: 800.0000 mg | ORAL_TABLET | Freq: Three times a day (TID) | ORAL | Status: AC | PRN

## 2011-07-18 NOTE — Progress Notes (Signed)
Subjective: Postpartum Day 2: Cesarean Delivery Patient reports tolerating PO, + flatus and no problems voiding.  Desires early discharge  Objective: Vital signs in last 24 hours: Temp:  [98.1 F (36.7 C)-98.2 F (36.8 C)] 98.2 F (36.8 C) (11/19 0600) Pulse Rate:  [68-84] 76  (11/19 0600) Resp:  [18] 18  (11/19 0600) BP: (100-115)/(66-68) 115/66 mmHg (11/19 0600) SpO2:  [95 %] 95 % (11/18 0830)  Physical Exam:  General: alert and cooperative Lochia: appropriate Uterine Fundus: firm Incision: healing well DVT Evaluation: No evidence of DVT seen on physical exam.   Basename 07/17/11 0529 07/16/11 1025  HGB 10.1* 12.7  HCT 29.5* 37.3    Assessment/Plan: Status post Cesarean section. Doing well postoperatively.  Discharge home with standard precautions and return to clinic in 1-2 weeks.  Sabastian Raimondi G 07/18/2011, 7:59 AM

## 2011-07-18 NOTE — Discharge Summary (Signed)
Obstetric Discharge Summary Reason for Admission: onset of labor and rupture of membranes Prenatal Procedures: ultrasound Intrapartum Procedures: cesarean: low cervical, transverse Postpartum Procedures: none Complications-Operative and Postpartum: none Hemoglobin  Date Value Range Status  07/17/2011 10.1* 12.0-15.0 (g/dL) Final     DELTA CHECK NOTED     REPEATED TO VERIFY     HCT  Date Value Range Status  07/17/2011 29.5* 36.0-46.0 (%) Final    Discharge Diagnoses: Premature labor  Discharge Information: Date: 07/18/2011 Activity: pelvic rest Diet: routine Medications: None and Ibuprofen Condition: stable Instructions: refer to practice specific booklet Discharge to: home   Newborn Data: Live born female  Birth Weight: 7 lb 3.5 oz (3275 g) APGAR: 8, 9  Home with mother.  Pranay Hilbun G 07/18/2011, 8:06 AM

## 2011-08-15 ENCOUNTER — Inpatient Hospital Stay (HOSPITAL_COMMUNITY)
Admission: RE | Admit: 2011-08-15 | Payer: BC Managed Care – PPO | Source: Ambulatory Visit | Admitting: Obstetrics and Gynecology

## 2011-08-15 ENCOUNTER — Encounter (HOSPITAL_COMMUNITY): Admission: RE | Payer: Self-pay | Source: Ambulatory Visit

## 2011-08-15 SURGERY — Surgical Case
Anesthesia: Regional

## 2013-04-18 ENCOUNTER — Telehealth: Payer: Self-pay | Admitting: Neurology

## 2013-04-22 ENCOUNTER — Telehealth: Payer: Self-pay | Admitting: Neurology

## 2013-04-22 NOTE — Telephone Encounter (Signed)
Reassigned to Dr. Terrace Arabia for migraines.

## 2013-07-29 ENCOUNTER — Ambulatory Visit: Payer: Self-pay | Admitting: Neurology

## 2013-08-05 ENCOUNTER — Encounter: Payer: Self-pay | Admitting: Neurology

## 2013-08-05 ENCOUNTER — Encounter (INDEPENDENT_AMBULATORY_CARE_PROVIDER_SITE_OTHER): Payer: Self-pay

## 2013-08-05 ENCOUNTER — Ambulatory Visit (INDEPENDENT_AMBULATORY_CARE_PROVIDER_SITE_OTHER): Payer: BC Managed Care – PPO | Admitting: Neurology

## 2013-08-05 VITALS — BP 99/64 | HR 74 | Ht 69.0 in | Wt 138.0 lb

## 2013-08-05 DIAGNOSIS — G43909 Migraine, unspecified, not intractable, without status migrainosus: Secondary | ICD-10-CM

## 2013-08-05 DIAGNOSIS — Q211 Atrial septal defect: Secondary | ICD-10-CM

## 2013-08-05 MED ORDER — TOPIRAMATE ER 50 MG PO CAP24: 50.0000 mg | ORAL_CAPSULE | Freq: Every day | ORAL | Status: DC

## 2013-08-05 NOTE — Progress Notes (Signed)
[facility]  PATIENT: Linda Wolfe DOB: July 07, 1976  HISTORICAL Linda Wolfe is a 37 years old right-handed Caucasian female, return to clinic in followup for migraine headache, she was previously a patient of Dr. 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  During previous severe migraine headaches, she also had  few episode of visual alteration, such as tunnel vision, sometimes marching numbness along her left side, occasionally with aphasia  Her headache was previously under good control with Topamax 25 mg twice a day, she has not taking Topamax as preventive medication for a while, until Summer of 2014, she began to have more frequent headaches to the point of couple times a week, few episode with associated tunnel vision, bilateral frontal severe pounding headache with associated light noise sensitivity, no nauseous, lasting couple hours, relieved by ibuprofen, or Tylenol.  She was seen by her primary care physician, was put on Topamax 25 mg since October 2014, which has been effective, she does complains of sleepiness with morning dose of Topamax, sometimes forgot to take it.  She never tried triptan treatment in the past, she is happy about ibuprofen Tylenol as abortive treatment.  REVIEW OF SYSTEMS: Full 14 system review of systems performed and notable only for headaches  ALLERGIES: Allergies  Allergen Reactions  . Codeine Nausea And Vomiting      HOME MEDICATIONS: Outpatient Prescriptions Prior to Visit  Medication Sig Dispense Refill  . acetaminophen (TYLENOL) 325 MG tablet Take 325 mg by mouth every 6 (six) hours as needed. For headaches.       . prenatal vitamin w/FE, FA (PRENATAL 1 + 1) 27-1 MG TABS Take 1 tablet by mouth daily.        . propranolol (INDERAL) 10 MG tablet Take 10 mg by mouth daily.         No facility-administered medications prior to visit.    PAST MEDICAL HISTORY: Past Medical History  Diagnosis Date  . Migraine     PAST SURGICAL HISTORY: Past Surgical History  Procedure Laterality Date  . Cesarean section    . Mouth surgery    . Cesarean section  07/16/2011    Procedure: CESAREAN SECTION;  Surgeon: Meriel Pica;  Location: [facility];  Service: Gynecology;  Laterality: N/A;  Repeat cesarean section with delivery of baby boy at 45. apgars 8/9. Bilateral tubal ligation with filshie clips.    FAMILY HISTORY: No family history on file.  SOCIAL HISTORY:  History   Social History  . Marital Status: Married    Spouse Name: [name]    Number of Children: 2  . Years of Education: masters   Occupational History  . [employer]    Social History Main Topics  . Smoking status: Never Smoker   . Smokeless tobacco: Never Used  . Alcohol Use: 0.5 oz/week    1 drink(s) per week     Comment: with dinner  . Drug Use: No  . Sexual Activity: Not on file   Other Topics Concern  . Not on  file   Social History Narrative   Patient lives at home with her husband Diplomatic Services operational officer)   Education- Masters   Caffeine- one cup    Right handed.     PHYSICAL EXAM   Filed Vitals:   08/05/13 0841  BP: 99/64  Pulse: 74  Height: 5\' 9"  (1.753 m)  Weight: 138 lb (62.596 kg)    Not recorded    Body mass index is 20.37 kg/(m^2).   Generalized: In no acute distress  Neck: Supple, no carotid bruits   Cardiac: Regular rate rhythm  Pulmonary: Clear to auscultation bilaterally  Musculoskeletal: No  deformity  Neurological examination  Mentation: Alert oriented to time, place, history taking, and causual conversation  Cranial nerve II-XII: Pupils were equal round reactive to light extraocular movements were full, Visual field were full on confrontational test. Bilateral fundi were sharp.  Facial sensation and strength were normal. Hearing was intact to finger rubbing bilaterally. Uvula tongue midline.  head turning and shoulder shrug and were normal and symmetric.Tongue protrusion into cheek strength was normal.  Motor: normal tone, bulk and strength.  Sensory: Intact to fine touch, pinprick, preserved vibratory sensation, and proprioception at toes.  Coordination: Normal finger to nose, heel-to-shin bilaterally there was no truncal ataxia  Gait: Rising up from seated position without assistance, normal stance, without trunk ataxia, moderate stride, good arm swing, smooth turning, able to perform tiptoe, and heel walking without difficulty.   Romberg signs: Negative  Deep tendon reflexes: Brachioradialis 2/2, biceps 2/2, triceps 2/2, patellar 2/2, Achilles 2/2, plantar responses were flexor bilaterally.   DIAGNOSTIC DATA (LABS, IMAGING, TESTING) - I reviewed patient records, labs, notes, testing and imaging myself where available.  Lab Results  Component Value Date   WBC 10.4 07/17/2011   HGB 10.1* 07/17/2011   HCT 29.5* 07/17/2011   MCV 91.0 07/17/2011   PLT 131* 07/17/2011    ASSESSMENT AND PLAN   37 years old Caucasian female, with past medical history of migraine headaches, PFO, has more frequent migraine in recent few months, she responded very well to Topamax, but complains of side effects such as sleepiness with 25 mg twice a day, will switch her to Trokendi xr 50 mg every night, continue ibuprofen, Tylenol as needed as supportive treatment, return to clinic in 6 months with Gerlene Fee, M.D. Ph.D.  [facility] [address], [address] [place], [place] [address] [phone]

## 2014-01-02 NOTE — Telephone Encounter (Signed)
Closing encounter

## 2014-01-06 ENCOUNTER — Other Ambulatory Visit: Payer: Self-pay | Admitting: Obstetrics and Gynecology

## 2014-02-03 ENCOUNTER — Encounter: Payer: Self-pay | Admitting: Nurse Practitioner

## 2014-02-03 ENCOUNTER — Ambulatory Visit (INDEPENDENT_AMBULATORY_CARE_PROVIDER_SITE_OTHER): Payer: BC Managed Care – PPO | Admitting: Nurse Practitioner

## 2014-02-03 VITALS — BP 94/59 | HR 73 | Ht 68.5 in | Wt 136.0 lb

## 2014-02-03 DIAGNOSIS — Q211 Atrial septal defect: Secondary | ICD-10-CM

## 2014-02-03 DIAGNOSIS — Q2111 Secundum atrial septal defect: Secondary | ICD-10-CM

## 2014-02-03 DIAGNOSIS — Q2112 Patent foramen ovale: Secondary | ICD-10-CM

## 2014-02-03 DIAGNOSIS — G43909 Migraine, unspecified, not intractable, without status migrainosus: Secondary | ICD-10-CM

## 2014-02-03 NOTE — Patient Instructions (Signed)
Continue trokendi at current dose Followup yearly and when necessary

## 2014-02-03 NOTE — Progress Notes (Signed)
[facility]  PATIENT: MERCER STALLWORTH DOB: 31-May-1976   REASON FOR VISIT: Followup for migraine   HISTORY OF PRESENT ILLNESS: Ms. Blackston, 38 year old female returns for followup. She was last seen in this office but Dr. Krista Blue 08/05/2013. She has a history of migraine headache, she was previously a patient of Dr. Erling Cruz. 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  During previous severe migraine headaches, she also had few episode of visual alteration, such as tunnel vision, sometimes marching numbness along her left side, occasionally with aphasia  Her headache was previously under good control with Topamax 25 mg twice a day, she has not taking Topamax as preventive medication for a while, until Summer of 2014, she began to have more frequent headaches to the point of couple times a week, few episode with associated tunnel vision, bilateral frontal severe pounding headache with associated light noise sensitivity, no nauseous, lasting couple hours, relieved by ibuprofen, or Tylenol.  She was seen by her primary care physician, was put on Topamax 25 mg since October 2014, which has been effective, she does complains of sleepiness with morning dose of Topamax, sometimes forgot to take it.  She never tried triptan treatment in the past, she is happy about ibuprofen Tylenol as abortive treatment.  She was placed on trokendi XR by Dr. Krista Blue and is tolerating the medication without side effects. She is pleased with  her response. Discussed migraine triggers .She returns for reevaluation     REVIEW OF SYSTEMS: Full 14 system review of systems performed and notable only for those listed, all others are neg:  Constitutional: N/A  Cardiovascular: N/A  Ear/Nose/Throat: N/A  Skin: N/A  Eyes: N/A  Respiratory: N/A  Gastroitestinal: N/A  Hematology/Lymphatic: N/A  Endocrine: N/A Musculoskeletal:N/A  Allergy/Immunology: N/A  Neurological: N/A Psychiatric: N/A Sleep : NA   ALLERGIES: Allergies  Allergen Reactions  . Codeine Nausea And Vomiting    HOME MEDICATIONS: Outpatient Prescriptions Prior to Visit  Medication Sig Dispense Refill  . Topiramate ER (TROKENDI XR) 50 MG CP24 Take 50 mg by mouth at bedtime.  30 capsule  12  . acetaminophen (TYLENOL) 325 MG tablet Take 325 mg by mouth every 6 (six) hours as needed. For headaches.       . topiramate (TOPAMAX) 25 MG tablet Take 25 mg by mouth 2 (two) times daily.       No facility-administered medications prior to visit.    PAST MEDICAL HISTORY: Past Medical History  Diagnosis Date  . Migraine     PAST SURGICAL HISTORY: Past Surgical History  Procedure Laterality Date  . Cesarean section    . Mouth surgery    . Cesarean section  07/16/2011    Procedure: CESAREAN SECTION;  Surgeon: Margarette Asal;  Location: [facility];  Service: Gynecology;  Laterality: N/A;  Repeat cesarean section with delivery of baby boy at 25. apgars 8/9. Bilateral tubal ligation with filshie clips.    FAMILY HISTORY: History reviewed. No pertinent family history.  SOCIAL HISTORY: History   Social History  . Marital Status: Married  Spouse Name: Rich    Number of Children: 2  . Years of Education: masters   Occupational History  . [employer]    Social History Main Topics  . Smoking status: Never Smoker   . Smokeless tobacco: Never Used  . Alcohol Use: 0.5 oz/week    1 drink(s) per week     Comment: with dinner  . Drug Use: No  . Sexual  Activity: Not on file   Other Topics Concern  . Not on file   Social History Narrative   Patient lives at home with her husband English as a second language teacher)   Education- Masters   Caffeine- one cup    Right handed.     PHYSICAL EXAM  Filed Vitals:   02/03/14 1001  BP: 94/59  Pulse: 73  Height: 5' 8.5" (1.74 m)  Weight: 136 lb (61.689 kg)   Body mass index is 20.38 kg/(m^2).  Generalized: Well developed, in no acute distress  Head: normocephalic and atraumatic,. Oropharynx benign  Neck: Supple, no carotid bruits  Cardiac: Regular rate rhythm, no murmur  Musculoskeletal: No deformity   Neurological examination   Mentation: Alert oriented to time, place, history taking. Follows all commands speech and language fluent  Cranial nerve II-XII: Pupils were equal round reactive to light extraocular movements were full, visual field were full on confrontational test. Facial sensation and strength were normal. hearing was intact to finger rubbing bilaterally. Uvula tongue midline. head turning and shoulder shrug were normal and symmetric.Tongue protrusion into cheek strength was normal. Motor: normal bulk and tone, full strength in the BUE, BLE, fine finger movements normal, no pronator drift. No focal weakness Sensory: normal and symmetric to light touch, pinprick, and  vibration  Coordination: finger-nose-finger, heel-to-shin bilaterally, no dysmetria Reflexes: Brachioradialis 2/2, biceps 2/2, triceps 2/2, patellar 2/2, Achilles 2/2, plantar responses were flexor bilaterally. Gait and Station: Rising up from seated position without assistance, normal stance,  moderate stride, good arm swing, smooth turning, able to perform tiptoe, and heel walking without difficulty. Tandem gait is steady  DIAGNOSTIC DATA (LABS, IMAGING, TESTING) -  ASSESSMENT AND PLAN  38 y.o. year old female  has a past medical history of Migraine. here to followup. She switched to trokendi XR and has no reported side  effects.  Continue trokendi at current dose, does not need refills Reviewed  migraine triggers specifically foods and environmental  Followup yearly and when necessary,  Dennie Bible, [facility], [facility], [facility] [address] [place], [place] [address] [phone]

## 2014-04-29 ENCOUNTER — Other Ambulatory Visit: Payer: Self-pay | Admitting: Obstetrics and Gynecology

## 2014-04-30 LAB — CYTOLOGY - PAP

## 2014-06-19 ENCOUNTER — Encounter: Payer: Self-pay | Admitting: Cardiology

## 2014-06-19 ENCOUNTER — Ambulatory Visit (INDEPENDENT_AMBULATORY_CARE_PROVIDER_SITE_OTHER): Payer: BC Managed Care – PPO | Admitting: Cardiology

## 2014-06-19 VITALS — BP 118/62 | HR 84 | Ht 69.0 in | Wt 136.0 lb

## 2014-06-19 DIAGNOSIS — Q2112 Patent foramen ovale: Secondary | ICD-10-CM

## 2014-06-19 DIAGNOSIS — Q211 Atrial septal defect: Secondary | ICD-10-CM

## 2014-06-19 DIAGNOSIS — R0789 Other chest pain: Secondary | ICD-10-CM

## 2014-06-19 DIAGNOSIS — R002 Palpitations: Secondary | ICD-10-CM | POA: Insufficient documentation

## 2014-06-19 DIAGNOSIS — I493 Ventricular premature depolarization: Secondary | ICD-10-CM | POA: Insufficient documentation

## 2014-06-19 LAB — BASIC METABOLIC PANEL
BUN: 14 mg/dL (ref 6–23)
CHLORIDE: 104 meq/L (ref 96–112)
CO2: 28 meq/L (ref 19–32)
Calcium: 9.6 mg/dL (ref 8.4–10.5)
Creatinine, Ser: 0.9 mg/dL (ref 0.4–1.2)
GFR: 78 mL/min (ref >60.00)
Glucose, Bld: 83 mg/dL (ref 70–99)
Potassium: 4 mEq/L (ref 3.5–5.1)
SODIUM: 138 meq/L (ref 135–145)

## 2014-06-19 LAB — TSH: TSH: 1.08 u[IU]/mL (ref 0.35–4.50)

## 2014-06-19 NOTE — Patient Instructions (Signed)
The current medical regimen is effective;  continue present plan and medications. You may take an extra 1/2 dose of Atenolol for palpitations as needed.  Please have blood work today. (BMP/TSH).  Your physician has requested that you have an echocardiogram. Echocardiography is a painless test that uses sound waves to create images of your heart. It provides your doctor with information about the size and shape of your heart and how well your heart's chambers and valves are working. This procedure takes approximately one hour. There are no restrictions for this procedure.  Please increase your fluid and salt intake.  Follow up as needed.

## 2014-06-19 NOTE — Progress Notes (Signed)
[facility]. [address], [address], [place]  [address] Phone: [phone] Fax:  [phone]  Date:  06/19/2014   ID:  Linda, Wolfe 02-Mar-1976, MRN [id number]  PCP:  Precious Reel, MD   History of Present Illness: Linda Wolfe is a 38 y.o. female here for evaluation of her palpitations. Friend of Dr. Cyndia Bent. Feeling heavy fluttering about 10 days ago, increased stress. Had 11 years ago in [place]. Sometimes feels really flushed. Neck stiffer. Felt hot nausea. Heart not racing. Feeling like it is jumping, catching up. Worse when resting. Upper chest, neck. Chest tightness. Checking pulse. No SOB, no syncope.   She has previously seen neurology secondary to migraine and has a past medical history of patent foramen ovale based upon transcranial Doppler bubble study.  She has had a history of cardiac evaluation with reportedly normal EKG, echocardiogram, Holter monitoring. She is pleased with her response to medication.  Off atenolol.    Wt Readings from Last 3 Encounters:  06/19/14 136 lb (61.689 kg)  02/03/14 136 lb (61.689 kg)  08/05/13 138 lb (62.596 kg)     Past Medical History  Diagnosis Date  . Migraine     Past Surgical History  Procedure Laterality Date  . Cesarean section    . Mouth surgery    . Cesarean section  07/16/2011    Procedure: CESAREAN SECTION;  Surgeon: Margarette Asal;  Location: [facility];  Service: Gynecology;  Laterality: N/A;  Repeat cesarean section with delivery of baby boy at 52. apgars 8/9. Bilateral tubal ligation with filshie clips.    Current Outpatient Prescriptions  Medication Sig Dispense Refill  . atenolol (TENORMIN) 25 MG tablet Take 25 mg by mouth as needed.      . Topiramate ER (TROKENDI XR) 50 MG CP24 Take by mouth. 1 tab daily       No current facility-administered medications for this visit.    Allergies:    Allergies  Allergen Reactions  . Codeine Nausea And Vomiting    Social History:  The patient   reports that she has never smoked. She has never used smokeless tobacco. She reports that she drinks about .5 ounces of alcohol per week. She reports that she does not use illicit drugs.   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 Husband was a Industrial/product designer at [employer]. Family history: no early family history of coronary artery disease  ROS:  Please see the history of present illness.   Denies any syncope, bleeding, orthopnea, PND, strokelike symptoms, rash, joint effusions   All other systems reviewed and negative.   PHYSICAL EXAM: VS:  BP 118/62  Pulse 84  Ht 5\' 9"  (1.753 m)  Wt 136 lb (61.689 kg)  BMI 20.07 kg/m2 Thin, well developed, in no acute distress HEENT: normal, [place]/AT, EOMI, no masses Neck: no JVD, normal carotid upstroke, no bruit Cardiac:  normal S1, S2; RRR; no murmur Lungs:  clear to auscultation bilaterally, no wheezing, rhonchi or rales Abd: soft, nontender, no hepatomegaly, no bruits Ext: no edema, 2+ distal pulses Skin: warm and dry GU: deferred Neuro: no focal abnormalities noted, AAO x 3  EKG:  06/19/14-sinus rhythm, 84, PVC, right axis deviation, poor R wave progression.   Prior EKG demonstrated PVC as well. Echocardiogram: 2/11 2004-normal mitral, aortic valve. Normal left ventricle, normal EF.  ASSESSMENT AND PLAN:  1. Palpitations/PVC - PVC was caught on EKG. These are her symptoms that she is having, "catch "with her  heart. She is to avoid caffeine, Sudafed. He has been under periods of increased stress which can potentiate PVCs. In 2004 cardiac workup was unremarkable. I do not think a Holter monitor is necessary at this time since we caught on EKG/PVC. I think he would be reasonable for her to take a half dose of atenolol as needed for palpitations. She has normally low blood pressure. It would not be unreasonable for her to liberalize salt intake, hydration. She also has a torn right labrum. If echocardiogram is unremarkable, she may proceed with surgery. 2. Chest  tightness - will check echocardiogram . Likely associated with PVCs, stress, anxiety surrounding them. She has also had what sounds like a vagal type reaction at times, feeling hot, flushed after feeling PVCs. No syncope. Continue to liberalize salt intake, hydration. Known PFO. This was detected on transcranial Doppler. We discussed at length trial data on closure device. I would not endorse closure of PFO. 3. I will check basic metabolic profile and TSH, echocardiogram. I will followup with testing. If she needs further assistance, she knows to call. We will see back on when necessary visit.  Signed, Candee Furbish, MD [facility]  06/19/2014 10:16 AM

## 2014-06-23 ENCOUNTER — Ambulatory Visit (HOSPITAL_COMMUNITY): Payer: BC Managed Care – PPO | Attending: Cardiovascular Disease

## 2014-06-23 DIAGNOSIS — G43909 Migraine, unspecified, not intractable, without status migrainosus: Secondary | ICD-10-CM | POA: Insufficient documentation

## 2014-06-23 DIAGNOSIS — R0789 Other chest pain: Secondary | ICD-10-CM

## 2014-06-23 DIAGNOSIS — R002 Palpitations: Secondary | ICD-10-CM | POA: Diagnosis present

## 2014-06-23 NOTE — Progress Notes (Signed)
2D Echo completed. 06/23/2014

## 2014-06-30 ENCOUNTER — Encounter: Payer: Self-pay | Admitting: Cardiology

## 2014-07-18 ENCOUNTER — Ambulatory Visit: Payer: BC Managed Care – PPO | Admitting: Cardiology

## 2014-07-29 HISTORY — PX: OTHER SURGICAL HISTORY: SHX169

## 2014-08-08 ENCOUNTER — Other Ambulatory Visit: Payer: Self-pay | Admitting: Neurology

## 2014-11-28 DIAGNOSIS — D1800 Hemangioma unspecified site: Secondary | ICD-10-CM

## 2014-11-28 HISTORY — DX: Hemangioma unspecified site: D18.00

## 2014-11-28 HISTORY — PX: OTHER SURGICAL HISTORY: SHX169

## 2014-12-14 ENCOUNTER — Other Ambulatory Visit: Payer: Self-pay | Admitting: Neurology

## 2015-02-04 ENCOUNTER — Encounter: Payer: Self-pay | Admitting: Nurse Practitioner

## 2015-02-04 ENCOUNTER — Ambulatory Visit (INDEPENDENT_AMBULATORY_CARE_PROVIDER_SITE_OTHER): Payer: BLUE CROSS/BLUE SHIELD | Admitting: Nurse Practitioner

## 2015-02-04 VITALS — BP 108/66 | HR 84 | Ht 69.0 in | Wt 144.8 lb

## 2015-02-04 DIAGNOSIS — G43909 Migraine, unspecified, not intractable, without status migrainosus: Secondary | ICD-10-CM

## 2015-02-04 DIAGNOSIS — Q211 Atrial septal defect: Secondary | ICD-10-CM

## 2015-02-04 DIAGNOSIS — Q2112 Patent foramen ovale: Secondary | ICD-10-CM

## 2015-02-04 MED ORDER — TOPIRAMATE ER 50 MG PO CAP24: 1.0000 | ORAL_CAPSULE | Freq: Every day | ORAL | Status: DC

## 2015-02-04 NOTE — Progress Notes (Signed)
[facility]  PATIENT: Linda Wolfe DOB: Jul 29, 1976   REASON FOR VISIT: Follow-up for migraine  HISTORY FROM: Patient    HISTORY OF PRESENT ILLNESS: Linda Wolfe, 39 year old female returns for follow-up. She has a long history of migraines and is currently on trokendi 50 mg extended release. Her headaches are well controlled and she is pleased with her response. She had recent right shoulder surgery and is back to her normal activities. She is on Toprol for occasional palpitations, she takes it when necessary about once every 6 months she says. Recent 2-D echo was normal according to the patient. She returns for reevaluation and she needs refills   HISTORY: Linda Wolfe, 39 year old female returns for followup. She was last seen in this office but Dr. Krista Blue 08/05/2013. She has a history of migraine headache, she was previously a patient of Dr. Erling Cruz. 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  During previous severe migraine headaches, she also had few episode of visual alteration, such as tunnel vision, sometimes marching numbness along her left side, occasionally with aphasia  Her headache was previously under good control with Topamax 25 mg twice a day, she has not taking Topamax as preventive medication for a while, until Summer of 2014, she began to have more frequent headaches to the point of couple times a week, few episode with associated tunnel vision, bilateral frontal severe pounding headache  with associated light noise sensitivity, no nauseous, lasting couple hours, relieved by ibuprofen, or Tylenol.  She was seen by her primary care physician, was put on Topamax 25 mg since October 2014, which has been effective, she does complains of sleepiness with morning dose of Topamax, sometimes forgot to take it.  She never tried triptan treatment in the past, she is happy about ibuprofen Tylenol as abortive treatment.  She was placed on trokendi XR by Dr. Krista Blue and is tolerating the medication without side effects. She is pleased with her response. Discussed migraine triggers .She returns for reevaluation     REVIEW OF SYSTEMS: Full 14 system review of systems performed and notable only for those listed, all others are neg:  Constitutional: neg  Cardiovascular: neg Ear/Nose/Throat: neg  Skin: neg Eyes: neg Respiratory: neg Gastroitestinal: neg  Hematology/Lymphatic: neg  Endocrine: neg Musculoskeletal:neg Allergy/Immunology: Environmental Neurological: Headaches well controlled with medication Psychiatric: neg Sleep : neg   ALLERGIES: Allergies  Allergen Reactions  . Codeine Nausea And Vomiting    HOME MEDICATIONS: Outpatient Prescriptions Prior to Visit  Medication Sig Dispense Refill  . atenolol (TENORMIN) 25 MG tablet Take 25 mg by mouth as needed.    Marland Kitchen TROKENDI XR 50 MG CP24 TAKE ONE CAPSULE BY MOUTH AT BEDTIME 30 capsule 2   No facility-administered medications prior to visit.    PAST MEDICAL HISTORY: Past Medical History  Diagnosis Date  . Migraine   . Venous hemangioma 11/2014    roof of mouth (excised)    PAST SURGICAL HISTORY: Past Surgical History  Procedure Laterality Date  . Cesarean section    . Mouth surgery    . Cesarean section  07/16/2011    Procedure: CESAREAN SECTION;  Surgeon: Margarette Asal;  Location: [facility];  Service: Gynecology;  Laterality: N/A;  Repeat cesarean section with delivery of baby boy at 53. apgars 8/9. Bilateral  tubal ligation with filshie clips.    FAMILY HISTORY: No family history on file.  SOCIAL HISTORY: History   Social History  . Marital Status: Married    Spouse Name: Denice Paradise  . Number of Children: 2  . Years of Education: masters   Occupational History  . [email or web address]    Social History Main Topics  . Smoking status: Never Smoker   . Smokeless tobacco: Never Used  . Alcohol Use: 0.5 oz/week    1 drink(s) per week     Comment: with dinner  . Drug Use: No  . Sexual Activity: Not on file   Other Topics Concern  . Not on file   Social History Narrative   Patient lives at home with her husband English as a second language teacher)   Education- Masters   Caffeine- one cup    Right handed.     PHYSICAL EXAM  Filed Vitals:   02/04/15 0937  BP: 108/66  Pulse: 84  Height: 5\' 9"  (1.753 m)  Weight: 144 lb 12.8 oz (65.681 kg)   Body mass index is 21.37 kg/(m^2). Generalized: Well developed, in no acute distress  Head: normocephalic and atraumatic,. Oropharynx benign  Neck: Supple, no carotid bruits  Cardiac: Regular rate rhythm, no murmur  Musculoskeletal: No deformity   Neurological examination   Mentation: Alert oriented to time, place, history taking. Follows all commands speech and language fluent  Cranial nerve II-XII: Pupils were equal round reactive to light extraocular movements were full, visual field were full on confrontational test. Facial sensation and strength were normal. hearing was intact to finger rubbing bilaterally. Uvula tongue midline. head turning and shoulder shrug were normal and symmetric.Tongue protrusion into cheek strength was normal. Motor: normal bulk and tone, full strength in the BUE, BLE, fine finger movements normal, no pronator drift. No focal weakness Sensory: normal and symmetric to light touch, pinprick, and vibration  Coordination: finger-nose-finger, heel-to-shin bilaterally, no dysmetria Reflexes: Brachioradialis 2/2, biceps 2/2, triceps 2/2, patellar  2/2, Achilles 2/2, plantar responses were flexor bilaterally. Gait and Station: Rising up from seated position without assistance, normal stance, moderate stride, good arm swing, smooth turning, able to perform tiptoe, and heel walking without difficulty. Tandem gait is steady   DIAGNOSTIC DATA (LABS, IMAGING, TESTING) -  ASSESSMENT AND PLAN  39 y.o. year old female  has a past medical history of Migraine and Venous hemangioma (11/2014). here to follow-up. Migraines well-controlled trokendi extended release.  Continue trokendi at the current dose will refill 3 months with 3 refills Call for increase in headaches Follow-up yearly and when necessary Dennie Bible, [facility], [facility], [facility] [address] [place], [place] [address] [phone]

## 2015-02-04 NOTE — Patient Instructions (Signed)
Continue trokendi at the current dose will refill 3 months with 3 refills Call for increase in headaches Follow-up yearly and when necessary

## 2015-02-06 NOTE — Progress Notes (Signed)
I have reviewed and agreed above plan. 

## 2015-05-21 ENCOUNTER — Other Ambulatory Visit: Payer: Self-pay | Admitting: Obstetrics and Gynecology

## 2015-05-22 LAB — CYTOLOGY - PAP

## 2016-02-04 ENCOUNTER — Ambulatory Visit (INDEPENDENT_AMBULATORY_CARE_PROVIDER_SITE_OTHER): Payer: BLUE CROSS/BLUE SHIELD | Admitting: Nurse Practitioner

## 2016-02-04 ENCOUNTER — Encounter: Payer: Self-pay | Admitting: Nurse Practitioner

## 2016-02-04 VITALS — BP 98/66 | HR 75 | Ht 69.0 in | Wt 152.6 lb

## 2016-02-04 DIAGNOSIS — R002 Palpitations: Secondary | ICD-10-CM

## 2016-02-04 DIAGNOSIS — G43909 Migraine, unspecified, not intractable, without status migrainosus: Secondary | ICD-10-CM | POA: Diagnosis not present

## 2016-02-04 MED ORDER — TOPIRAMATE ER 50 MG PO CAP24: 1.0000 | ORAL_CAPSULE | Freq: Every day | ORAL | Status: DC

## 2016-02-04 NOTE — Patient Instructions (Signed)
Continue trokendi at the current dose will refill 3 months with 3 refills Call for increase in headaches Continue atenolol when necessary  Follow-up yearly and when necessary

## 2016-02-04 NOTE — Progress Notes (Signed)
[facility]  PATIENT: Linda Wolfe DOB: Mar 23, 1976   REASON FOR VISIT: Follow-up for migraine, PFO HISTORY FROM: Patient    HISTORY OF PRESENT ILLNESS:Linda Wolfe, 40 year old female returns for yearly routine followup. She has  migraines and is currently on trokendi 50 mg extended release. Her headaches are well controlled and she is pleased with her response.  She is on Toprol for occasional palpitations, she takes it when necessary about once every 6 months she says.  She returns for reevaluation and she needs refills   HISTORY: Linda Wolfe, 40 year old female returns for followup. She was last seen in this office but Dr. Krista Blue 08/05/2013. She has a history of migraine headache, she was previously a patient of Dr. Erling Cruz. 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  During previous severe migraine headaches, she also had few episode of visual alteration, such as tunnel vision, sometimes marching numbness along her left side, occasionally with aphasia  Her headache was previously under good control with Topamax 25 mg twice a day, she has not taking Topamax as preventive medication for a while, until Summer of 2014, she began to have more frequent headaches to the point of couple times a week, few episode with associated tunnel vision, bilateral frontal severe pounding headache with associated light noise sensitivity, no nauseous, lasting couple hours, relieved by ibuprofen, or Tylenol.  She was seen  by her primary care physician, was put on Topamax 25 mg since October 2014, which has been effective, she does complains of sleepiness with morning dose of Topamax, sometimes forgot to take it.  She never tried triptan treatment in the past, she is happy about ibuprofen Tylenol as abortive treatment.  She was placed on trokendi XR by Dr. Krista Blue and is tolerating the medication without side effects. She is pleased with her response. Discussed migraine triggers .She returns for reevaluation   REVIEW OF SYSTEMS: Full 14 system review of systems performed and notable only for those listed, all others are neg:  Constitutional: neg  Cardiovascular: neg Ear/Nose/Throat: neg  Skin: neg Eyes: neg Respiratory: neg Gastroitestinal: neg  Hematology/Lymphatic: neg  Endocrine: neg Musculoskeletal:neg Allergy/Immunology: neg Neurological: neg Psychiatric: neg Sleep : neg   ALLERGIES: Allergies  Allergen Reactions  . Codeine Nausea And Vomiting    HOME MEDICATIONS: Outpatient Prescriptions Prior to Visit  Medication Sig Dispense Refill  . atenolol (TENORMIN) 25 MG tablet Take 25 mg by mouth as needed.    . Topiramate ER (TROKENDI XR) 50 MG CP24 Take 1 capsule by mouth at bedtime. 90 capsule 3   No facility-administered medications prior to visit.    PAST MEDICAL HISTORY: Past Medical History  Diagnosis Date  . Migraine   . Venous hemangioma 11/2014    roof of mouth (excised)    PAST SURGICAL HISTORY: Past Surgical History  Procedure Laterality Date  . Cesarean section    . Mouth surgery    . Cesarean section  07/16/2011    Procedure: CESAREAN SECTION;  Surgeon: Margarette Asal;  Location: [facility];  Service: Gynecology;  Laterality: N/A;  Repeat cesarean  section with delivery of baby boy at 89. apgars 8/9. Bilateral tubal ligation with filshie clips.  . Excision venous hemangioma  11-2014    mouth  . R shoulder surgery Right 07-2014    FAMILY HISTORY: History reviewed. No  pertinent family history.  SOCIAL HISTORY: Social History   Social History  . Marital Status: Married    Spouse Name: Denice Paradise  . Number of Children: 2  . Years of Education: masters   Occupational History  . [email or web address]    Social History Main Topics  . Smoking status: Never Smoker   . Smokeless tobacco: Never Used  . Alcohol Use: 0.5 oz/week    1 drink(s) per week     Comment: with dinner  . Drug Use: No  . Sexual Activity: Not on file   Other Topics Concern  . Not on file   Social History Narrative   Patient lives at home with her husband English as a second language teacher)   Education- Masters   Caffeine- one cup    Right handed.     PHYSICAL EXAM  Filed Vitals:   02/04/16 0920  BP: 98/66  Pulse: 75  Height: 5\' 9"  (1.753 m)  Weight: 152 lb 9.6 oz (69.219 kg)   Body mass index is 22.52 kg/(m^2). Generalized: Well developed, in no acute distress  Head: normocephalic and atraumatic,. Oropharynx benign  Neck: Supple, no carotid bruits  Cardiac: Regular rate rhythm, no murmur  Musculoskeletal: No deformity   Neurological examination   Mentation: Alert oriented to time, place, history taking. Follows all commands speech and language fluent  Cranial nerve II-XII: Pupils were equal round reactive to light extraocular movements were full, visual field were full on confrontational test. Facial sensation and strength were normal. hearing was intact to finger rubbing bilaterally. Uvula tongue midline. head turning and shoulder shrug were normal and symmetric.Tongue protrusion into cheek strength was normal. Motor: normal bulk and tone, full strength in the BUE, BLE, fine finger movements normal, no pronator drift. No focal weakness Sensory: normal and symmetric to light touch, pinprick, and vibration  Coordination: finger-nose-finger, heel-to-shin bilaterally, no dysmetria Reflexes: Brachioradialis 2/2, biceps 2/2, triceps 2/2, patellar 2/2, Achilles 2/2, plantar responses were flexor  bilaterally. Gait and Station: Rising up from seated position without assistance, normal stance, moderate stride, good arm swing, smooth turning, able to perform tiptoe, and heel walking without difficulty. Tandem gait is steady on  DIAGNOSTIC DATA (LABS, IMAGING, TESTING) -  ASSESSMENT AND PLAN 40 y.o. year old female  has a past medical history of Migraine and Venous hemangioma (11/2014). here to follow-up. Migraines well-controlled trokendi extended release.  Continue trokendi at the current dose will refill 3 months with 3 refills Continue atenolol when necessary  Call for increase in headaches Follow-up yearly and when necessary Dennie Bible, [facility], [facility], [facility] [address], [place], [place] [address] [phone]

## 2016-02-08 ENCOUNTER — Telehealth: Payer: Self-pay | Admitting: *Deleted

## 2016-02-08 NOTE — Telephone Encounter (Signed)
PA approval ID # [id number], approval x one yr. Trokendi XR 50mg  caps.  [phone] , fax [phone].

## 2016-02-26 DIAGNOSIS — L820 Inflamed seborrheic keratosis: Secondary | ICD-10-CM | POA: Diagnosis not present

## 2016-07-11 DIAGNOSIS — H10413 Chronic giant papillary conjunctivitis, bilateral: Secondary | ICD-10-CM | POA: Diagnosis not present

## 2016-07-19 DIAGNOSIS — Z13220 Encounter for screening for lipoid disorders: Secondary | ICD-10-CM | POA: Diagnosis not present

## 2016-07-19 DIAGNOSIS — Z01419 Encounter for gynecological examination (general) (routine) without abnormal findings: Secondary | ICD-10-CM | POA: Diagnosis not present

## 2016-07-19 DIAGNOSIS — Z6822 Body mass index (BMI) 22.0-22.9, adult: Secondary | ICD-10-CM | POA: Diagnosis not present

## 2016-07-19 DIAGNOSIS — Z13228 Encounter for screening for other metabolic disorders: Secondary | ICD-10-CM | POA: Diagnosis not present

## 2016-07-19 DIAGNOSIS — Z1329 Encounter for screening for other suspected endocrine disorder: Secondary | ICD-10-CM | POA: Diagnosis not present

## 2016-07-19 DIAGNOSIS — Z131 Encounter for screening for diabetes mellitus: Secondary | ICD-10-CM | POA: Diagnosis not present

## 2016-07-19 DIAGNOSIS — Z1231 Encounter for screening mammogram for malignant neoplasm of breast: Secondary | ICD-10-CM | POA: Diagnosis not present

## 2016-08-01 DIAGNOSIS — Z23 Encounter for immunization: Secondary | ICD-10-CM | POA: Diagnosis not present

## 2016-12-22 DIAGNOSIS — J3089 Other allergic rhinitis: Secondary | ICD-10-CM | POA: Diagnosis not present

## 2016-12-22 DIAGNOSIS — J301 Allergic rhinitis due to pollen: Secondary | ICD-10-CM | POA: Diagnosis not present

## 2016-12-22 DIAGNOSIS — H1045 Other chronic allergic conjunctivitis: Secondary | ICD-10-CM | POA: Diagnosis not present

## 2017-02-02 ENCOUNTER — Encounter: Payer: Self-pay | Admitting: Nurse Practitioner

## 2017-02-02 ENCOUNTER — Ambulatory Visit (INDEPENDENT_AMBULATORY_CARE_PROVIDER_SITE_OTHER): Payer: BLUE CROSS/BLUE SHIELD | Admitting: Nurse Practitioner

## 2017-02-02 VITALS — BP 104/70 | HR 72 | Ht 69.0 in | Wt 147.8 lb

## 2017-02-02 DIAGNOSIS — R002 Palpitations: Secondary | ICD-10-CM | POA: Diagnosis not present

## 2017-02-02 DIAGNOSIS — G43909 Migraine, unspecified, not intractable, without status migrainosus: Secondary | ICD-10-CM | POA: Diagnosis not present

## 2017-02-02 MED ORDER — TOPIRAMATE ER 50 MG PO CAP24: 1.0000 | ORAL_CAPSULE | Freq: Every day | ORAL | 3 refills | Status: DC

## 2017-02-02 NOTE — Progress Notes (Signed)
[facility]  PATIENT: Linda Wolfe DOB: 1975-09-07   REASON FOR VISIT: Follow-up for migraine, PFO HISTORY FROM: Patient    HISTORY OF PRESENT ILLNESS:Linda Wolfe, 41 year old female returns for yearly routine followup.She has  migraines and is currently on trokendi 50 mg extended release. Her headaches are well controlled and she is pleased with her response. She currently has a migraine about every 3 months and a regular headache  Monthly.  She is on Toprol for occasional palpitations, she takes it when necessary about once every 6 months she says.  Her triggers are wine  and sleep deprivation She returns for reevaluation.   HISTORY: Linda Wolfe, 41 year old female returns for followup. She was last seen in this office but Dr. Krista Blue 08/05/2013. She has a history of migraine headache, she was previously a patient of Dr. Erling Cruz. 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  During previous severe migraine headaches, she also had few episode of visual alteration, such as tunnel vision, sometimes marching numbness along her left side, occasionally with aphasia  Her headache was previously under good control with Topamax 25 mg twice a day, she has not taking Topamax as preventive medication for a while, until Summer of 2014, she began to have more frequent headaches to the point of couple times a week, few episode with associated tunnel vision, bilateral frontal severe pounding headache with associated  light noise sensitivity, no nauseous, lasting couple hours, relieved by ibuprofen, or Tylenol.  She was seen by her primary care physician, was put on Topamax 25 mg since October 2014, which has been effective, she does complains of sleepiness with morning dose of Topamax, sometimes forgot to take it.  She never tried triptan treatment in the past, she is happy about ibuprofen Tylenol as abortive treatment.  She was placed on trokendi XR by Dr. Krista Blue and is tolerating the medication without side effects. She is pleased with her response. Discussed migraine triggers .She returns for reevaluation   REVIEW OF SYSTEMS: Full 14 system review of systems performed and notable only for those listed, all others are neg:  Constitutional: neg  Cardiovascular: neg Ear/Nose/Throat: neg  Skin: neg Eyes: neg Respiratory: neg Gastroitestinal: neg  Hematology/Lymphatic: neg  Endocrine: neg Musculoskeletal:neg Allergy/Immunology: neg Neurological: neg Psychiatric: neg Sleep : neg   ALLERGIES: Allergies  Allergen Reactions  . Codeine Nausea And Vomiting    HOME MEDICATIONS: Outpatient Medications Prior to Visit  Medication Sig Dispense Refill  . atenolol (TENORMIN) 25 MG tablet Take 25 mg by mouth as needed.    . Topiramate ER (TROKENDI XR) 50 MG CP24 Take 1 capsule by mouth at bedtime. 90 capsule 3   No facility-administered medications prior to visit.     PAST MEDICAL HISTORY: Past Medical History:  Diagnosis Date  . Migraine   . Venous hemangioma 11/2014   roof of mouth (excised)    PAST SURGICAL HISTORY: Past Surgical History:  Procedure Laterality Date  . CESAREAN SECTION    . CESAREAN SECTION  07/16/2011   Procedure: CESAREAN SECTION;  Surgeon: Margarette Asal;  Location: [facility];  Service: Gynecology;  Laterality: N/A;  Repeat cesarean section with delivery of baby boy at 50. apgars 8/9. Bilateral tubal ligation with filshie clips.  Marland Kitchen excision venous hemangioma  11-2014    mouth  . MOUTH SURGERY    . R shoulder surgery Right 07-2014    FAMILY HISTORY: History reviewed. No pertinent family history.  SOCIAL HISTORY: Social History   Social History  . Marital status: Married    Spouse name: Rich  . Number of children: 2  . Years of education: masters   Occupational History  . [employer]   Social History Main Topics  . Smoking status: Never Smoker  . Smokeless tobacco: Never Used  . Alcohol use 0.5 oz/week    1 drink(s) per week     Comment: with dinner  . Drug use: No  . Sexual activity: Not on file   Other Topics Concern  . Not on file   Social History Narrative   Patient lives at home with her husband English as a second language teacher)   Education- Masters   Caffeine- one cup    Right handed.     PHYSICAL EXAM  Vitals:   02/02/17 0906  BP: 104/70  Pulse: 72  Weight: 147 lb 12.8 oz (67 kg)  Height: 5\' 9"  (1.753 m)   Body mass index is 21.83 kg/m. Generalized: Well developed, in no acute distress  Head: normocephalic and atraumatic,. Oropharynx benign  Neck: Supple,   Musculoskeletal: No deformity   Neurological examination   Mentation: Alert oriented to time, place, history taking. Follows all commands speech and language fluent  Cranial nerve II-XII: Pupils were equal round reactive to light extraocular movements were full, visual field were full on confrontational test. Facial sensation and strength were normal. hearing was intact to finger rubbing bilaterally. Uvula tongue midline. head turning and shoulder shrug were normal and symmetric.Tongue protrusion into cheek strength was normal. Motor: normal bulk and tone, full strength in the BUE, BLE, fine finger movements normal, no pronator drift. No focal weakness Sensory: normal and symmetric to light touch, pinprick, and vibration In the upper and lower extremities Coordination: finger-nose-finger, heel-to-shin bilaterally, no dysmetria Reflexes: Brachioradialis 2/2, biceps  2/2, triceps 2/2, patellar 2/2, Achilles 2/2, plantar responses were flexor bilaterally. Gait and Station: Rising up from seated position without assistance, normal stance, moderate stride, good arm swing, smooth turning, able to perform tiptoe, and heel walking without difficulty. Tandem gait is steady on  DIAGNOSTIC DATA (LABS, IMAGING, TESTING) -  ASSESSMENT AND PLAN 41 y.o. year old female  has a past medical history of Migraine and Venous hemangioma (11/2014). here to follow-up. Migraines well-controlled trokendi extended release. She has about one migraine every 3 months and a regular headache monthly  Continue trokendi at the current dose will refill 3 months with 3 refills Continue atenolol when necessary or palpitations Call for increase in headaches Migraine tracker APP to record headaches/migraines Follow-up yearly and when necessary I spent 15 min in total face to face time with the patient more than 50% of which was spent counseling and coordination of care, reviewing test results reviewing medications and discussing and reviewing the diagnosis of migraine and the importance of avoiding triggers,  and further treatment options. , Rayburn Ma, [facility], APRN  [facility] [address] [place], [place] [address] [phone]

## 2017-02-02 NOTE — Patient Instructions (Signed)
Continue trokendi at the current dose will refill 3 months with 3 refills Continue atenolol when necessary  Call for increase in headaches Migraine tracker APP Follow-up yearly and when necessary

## 2017-02-10 NOTE — Progress Notes (Signed)
I have reviewed and agreed above plan. 

## 2017-04-28 ENCOUNTER — Ambulatory Visit (HOSPITAL_COMMUNITY)
Admission: RE | Admit: 2017-04-28 | Discharge: 2017-04-28 | Disposition: A | Discharge status: Home / Self Care | Payer: BLUE CROSS/BLUE SHIELD | Source: Ambulatory Visit | Attending: Internal Medicine | Admitting: Internal Medicine

## 2017-04-28 ENCOUNTER — Other Ambulatory Visit: Payer: Self-pay | Admitting: Internal Medicine

## 2017-04-28 ENCOUNTER — Other Ambulatory Visit (HOSPITAL_COMMUNITY): Payer: Self-pay | Admitting: Internal Medicine

## 2017-04-28 DIAGNOSIS — Z6820 Body mass index (BMI) 20.0-20.9, adult: Secondary | ICD-10-CM | POA: Diagnosis not present

## 2017-04-28 DIAGNOSIS — R1031 Right lower quadrant pain: Secondary | ICD-10-CM

## 2017-04-28 DIAGNOSIS — R1011 Right upper quadrant pain: Secondary | ICD-10-CM

## 2017-04-28 DIAGNOSIS — R109 Unspecified abdominal pain: Secondary | ICD-10-CM

## 2017-04-28 DIAGNOSIS — N83201 Unspecified ovarian cyst, right side: Secondary | ICD-10-CM | POA: Diagnosis not present

## 2017-04-28 DIAGNOSIS — N858 Other specified noninflammatory disorders of uterus: Secondary | ICD-10-CM | POA: Diagnosis not present

## 2017-05-15 DIAGNOSIS — N83201 Unspecified ovarian cyst, right side: Secondary | ICD-10-CM | POA: Diagnosis not present

## 2017-06-10 DIAGNOSIS — Z23 Encounter for immunization: Secondary | ICD-10-CM | POA: Diagnosis not present

## 2017-08-18 DIAGNOSIS — Z1389 Encounter for screening for other disorder: Secondary | ICD-10-CM | POA: Diagnosis not present

## 2017-08-18 DIAGNOSIS — N83201 Unspecified ovarian cyst, right side: Secondary | ICD-10-CM | POA: Diagnosis not present

## 2017-08-18 DIAGNOSIS — J3089 Other allergic rhinitis: Secondary | ICD-10-CM | POA: Diagnosis not present

## 2017-08-18 DIAGNOSIS — G43909 Migraine, unspecified, not intractable, without status migrainosus: Secondary | ICD-10-CM | POA: Diagnosis not present

## 2017-08-18 DIAGNOSIS — Q211 Atrial septal defect: Secondary | ICD-10-CM | POA: Diagnosis not present

## 2017-08-18 DIAGNOSIS — Z0000 Encounter for general adult medical examination without abnormal findings: Secondary | ICD-10-CM | POA: Diagnosis not present

## 2017-10-01 IMAGING — US US TRANSVAGINAL NON-OB
1 series · 13 of 25 positions shown · non-contrast
Comparison: None

CLINICAL DATA: Right lower quadrant abdominal pain for the past
week. Previous endometrial ablation and bilateral tubal ligation.

EXAM:
TRANSABDOMINAL AND TRANSVAGINAL ULTRASOUND OF PELVIS
TECHNIQUE: Both transabdominal and transvaginal ultrasound examinations of the
pelvis were performed. Transabdominal technique was performed for
global imaging of the pelvis including uterus, ovaries, adnexal
regions, and pelvic cul-de-sac. It was necessary to proceed with
endovaginal exam following the transabdominal exam to visualize the
right ovary and better visualize the left ovary and uterus.

[Series 1: us transvaginal non-ob · 0.20mm/px · 13 of 84 slices shown]
[im 1/84]
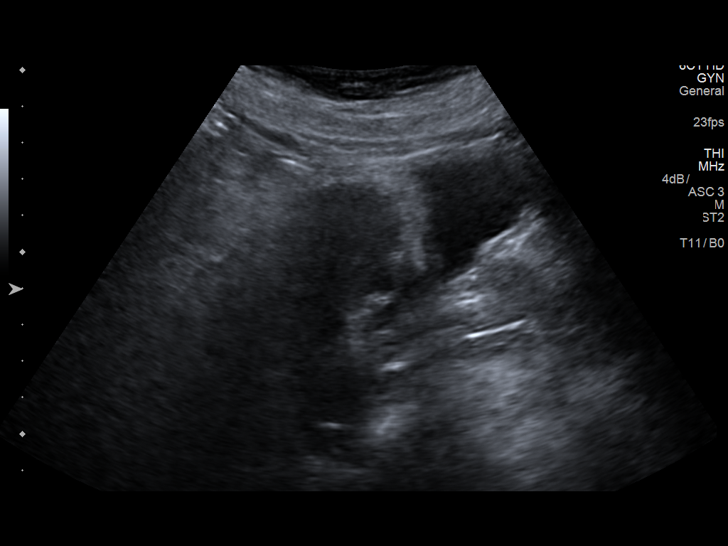
[im 7/84]
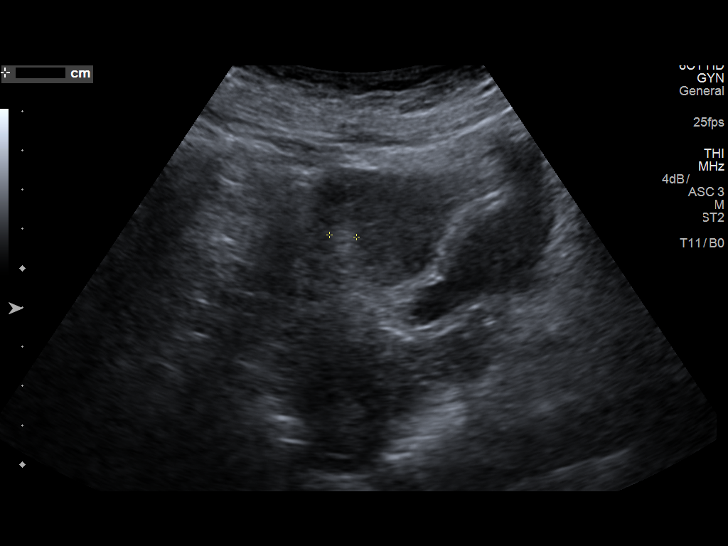
[im 14/84]
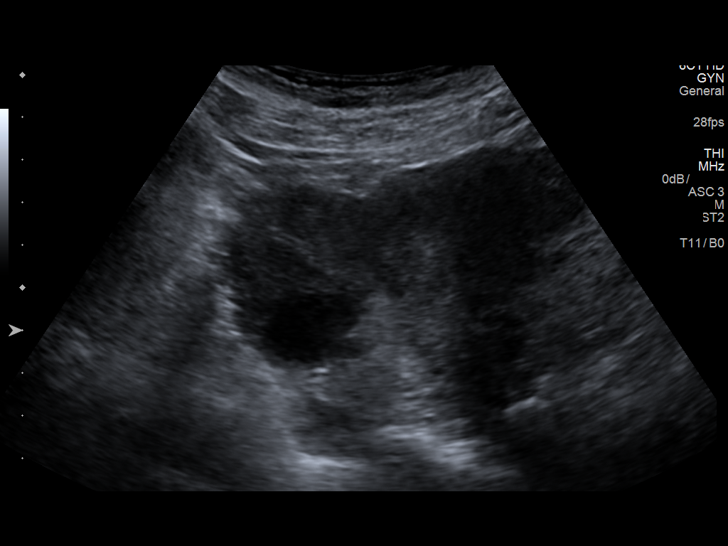
[im 21/84]
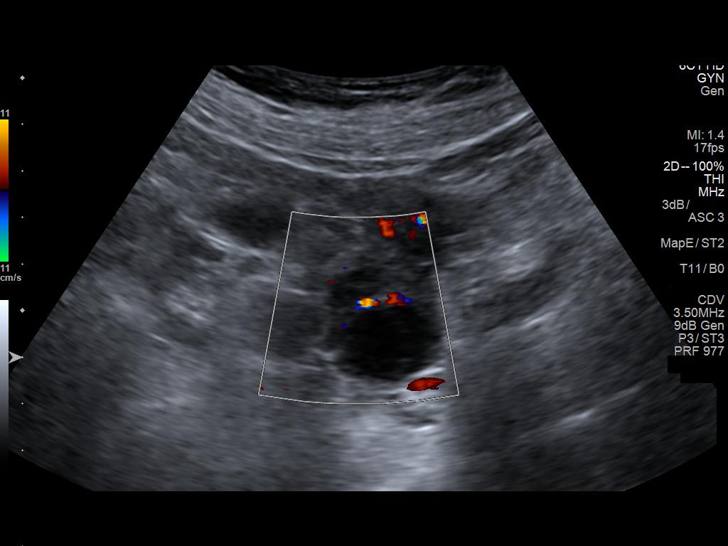
[im 28/84]
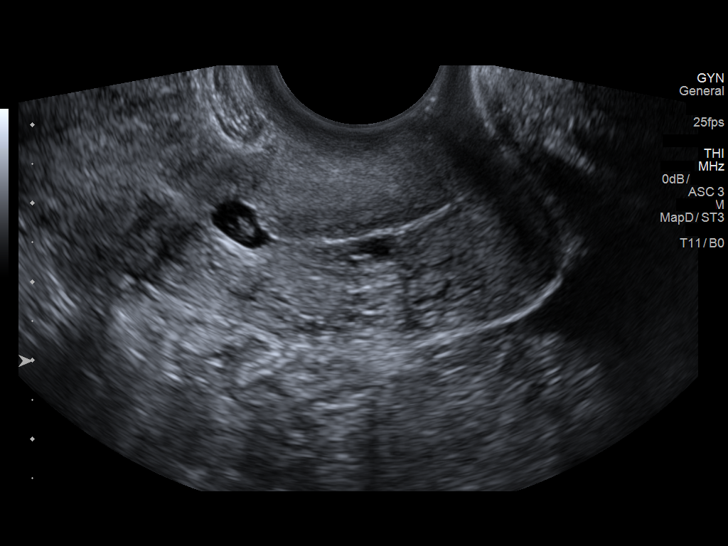
[im 35/84]
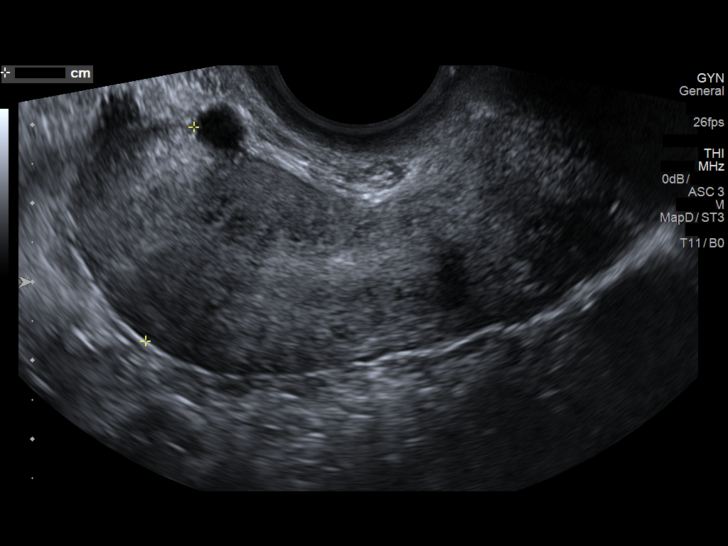
[im 42/84]
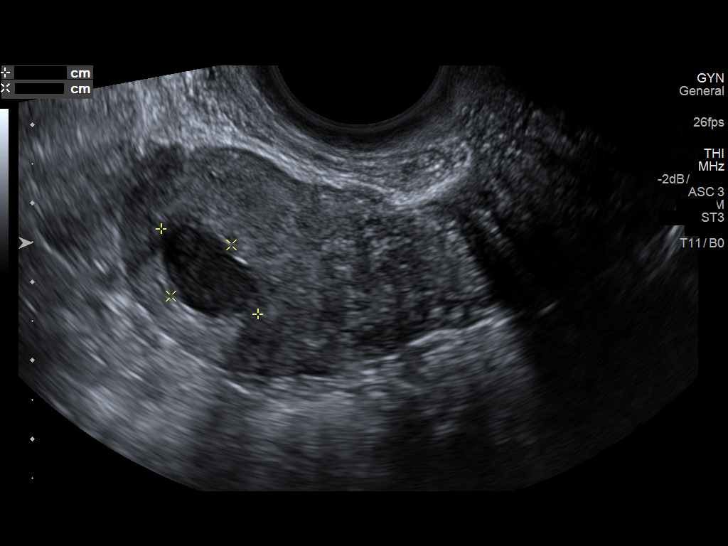
[im 49/84]
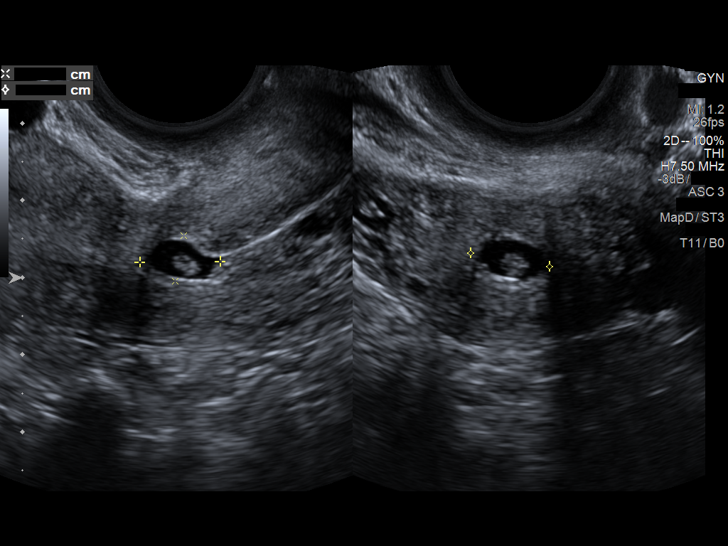
[im 56/84]
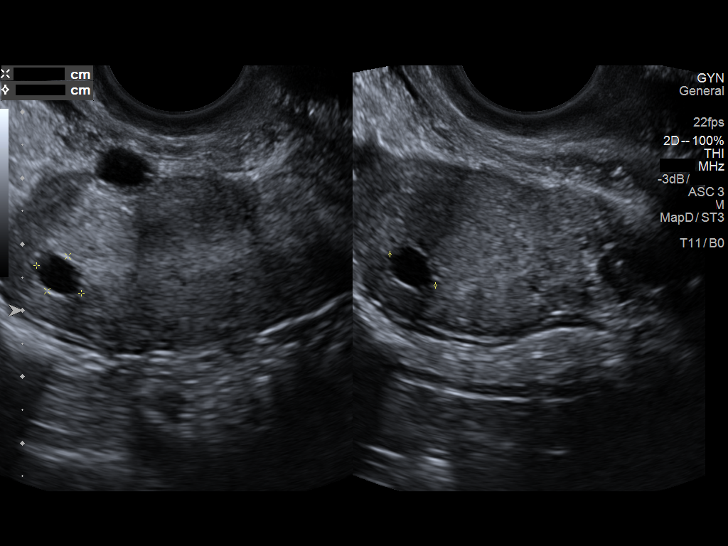
[im 63/84]
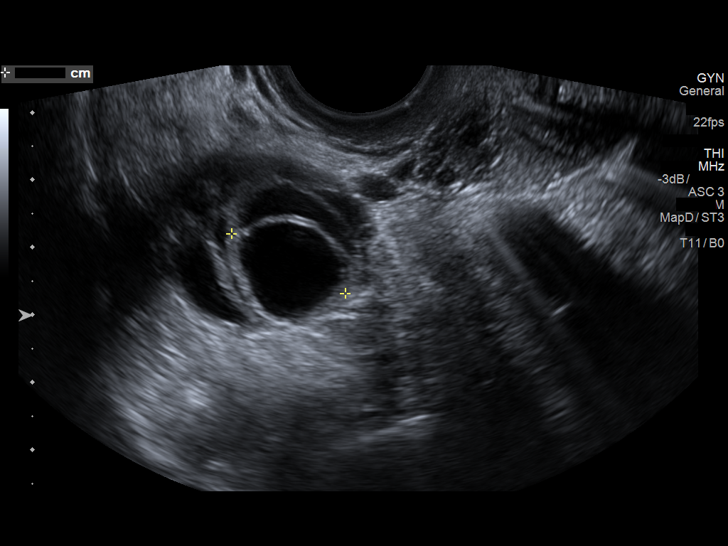
[im 70/84]
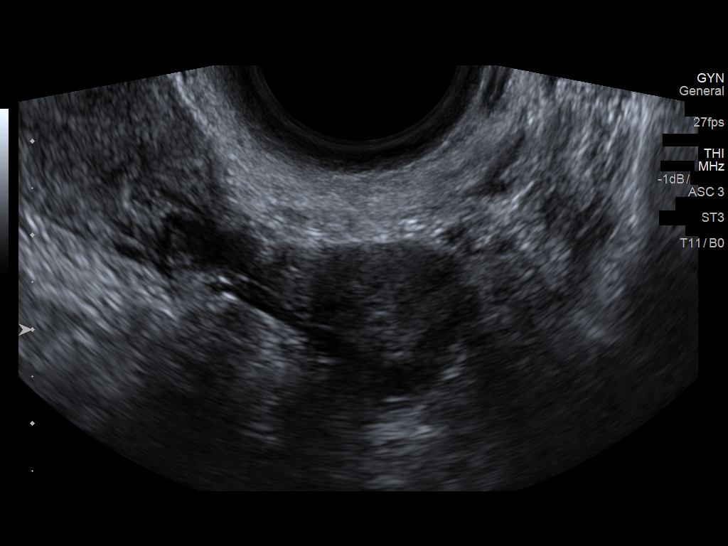
[im 77/84]
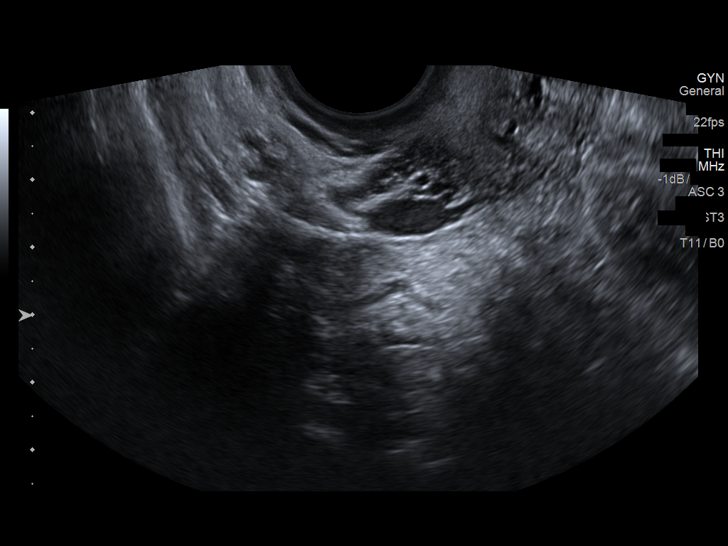
[im 84/84]
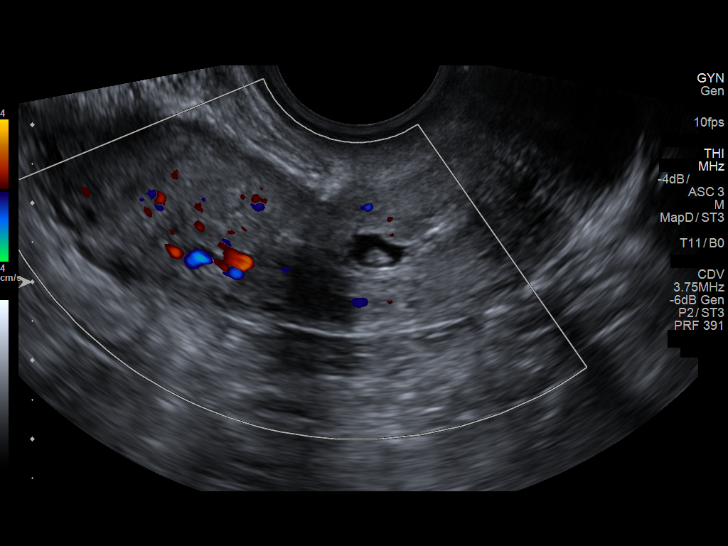

[13 of 25 positions shown; findings below may reference images not displayed]

FINDINGS: Uterus

Measurements: 8.0 x 5.1 x 2.8 cm. 1.1 x 0.9 x 0.9 cm exophytic
simple appearing cyst arising from the anterior aspect of the
uterine fundus.

0.8 x 0.8 x 0.6 cm similar appearing cyst at the superior aspect of
the fundal portion of the endometrium.

1.6 x 1.5 x 1.0 cm oval, hypoechoic area with no internal blood flow
posterior to the endometrial stripe in the uterine fundus, causing
mild distortion of the overlying stripe.

1.0 x 1.0 x 0.6 cm cyst containing an oval area of tumefactive
debris in the lower uterine segment, possibly in the endometrial
canal. No internal blood flow with color Doppler.

Endometrium

Thickness: 5.0 mm.  No focal abnormality visualized.

Right ovary

Measurements: 4.5 x 3.3 x 2.7 cm. Normal internal blood flow with
color Doppler. Probable corpus luteum cyst measuring 1.9 x 1.9 x
cm with additional smaller cystic areas and follicles.

Left ovary

Measurements: 2.6 x 1.9 x 1.8 cm. Normal internal blood flow with
color Doppler. Normal appearance/no adnexal mass.

Other findings

No abnormal free peritoneal fluid.
IMPRESSION: 1. No acute abnormality.
2. Multiple simple, complicated and complex cystic areas in the
uterus, as described above. These most likely represent changes due
to the patient's previous ablation.
3. 1.9 cm right ovarian probable corpus luteum cyst and additional
smaller cystic areas and follicles. These do not require follow-up.

## 2017-10-01 IMAGING — US US ABDOMEN LIMITED
1 series · 14 of 25 positions shown · non-contrast
Comparison: None.

CLINICAL DATA: Acute right upper quadrant abdominal pain.

EXAM:
ULTRASOUND ABDOMEN LIMITED RIGHT UPPER QUADRANT

[Series 1: us abdomen limited · 0.19mm/px · 14 of 43 slices shown]
[im 1/43]
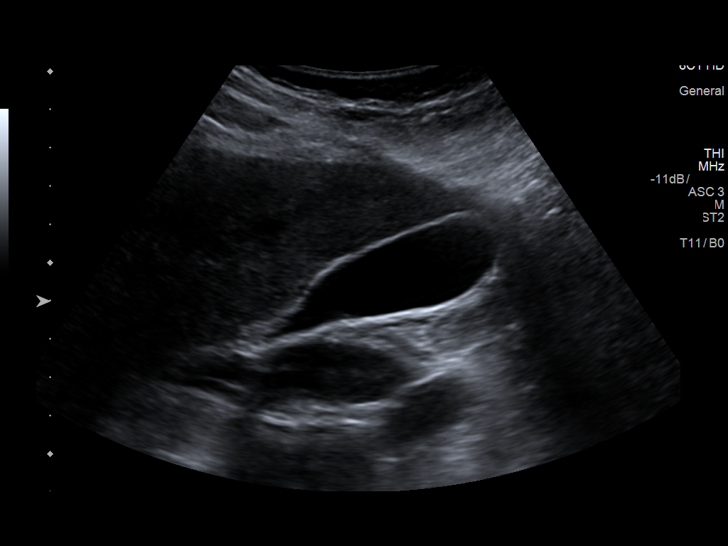
[im 4/43]
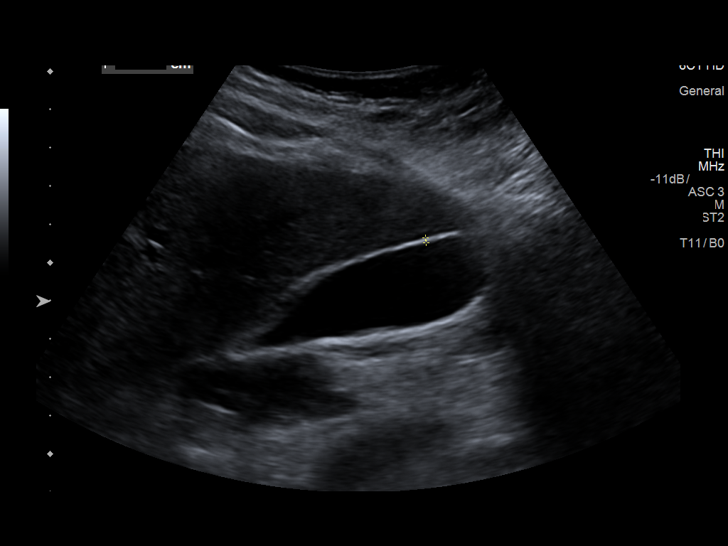
[im 8/43]
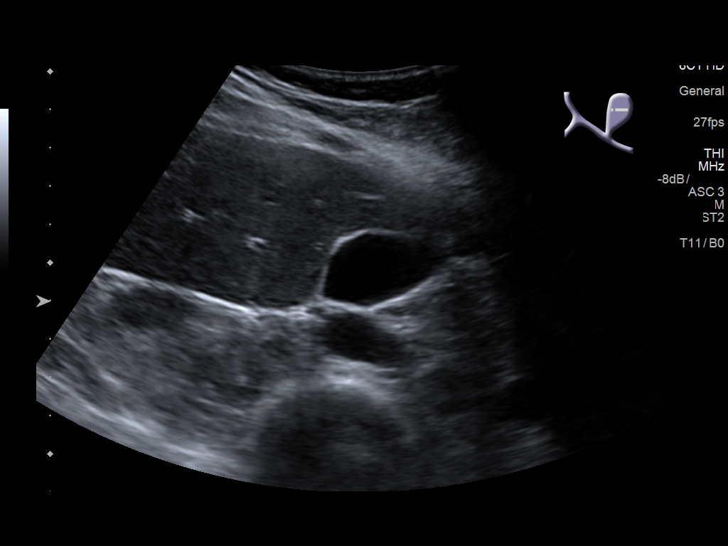
[im 11/43]
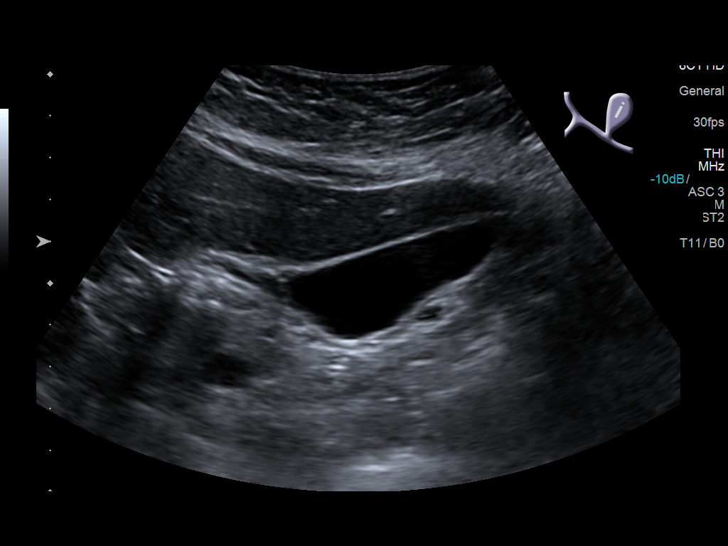
[im 15/43]
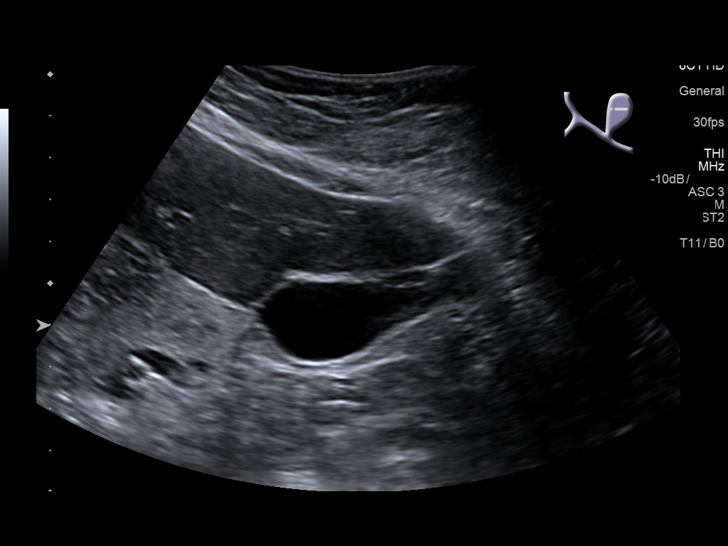
[im 16/43]
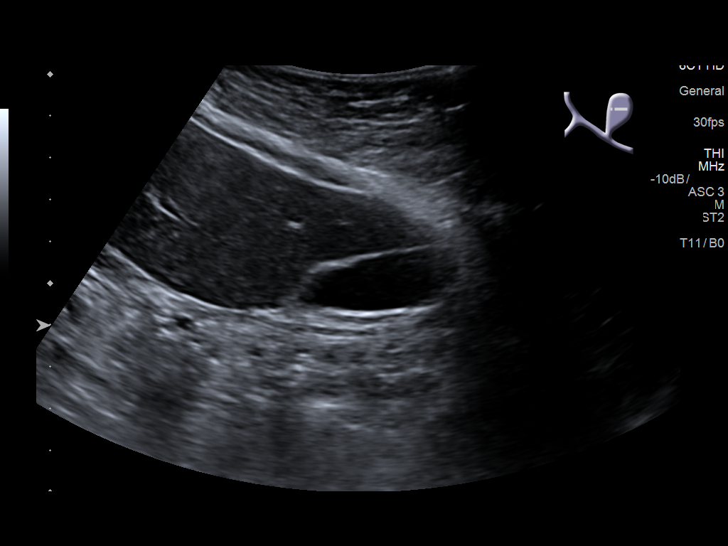
[im 20/43]
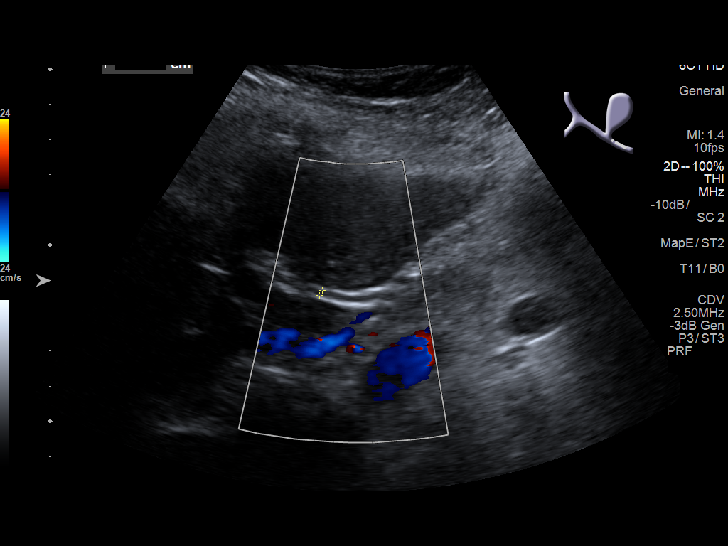
[im 23/43]
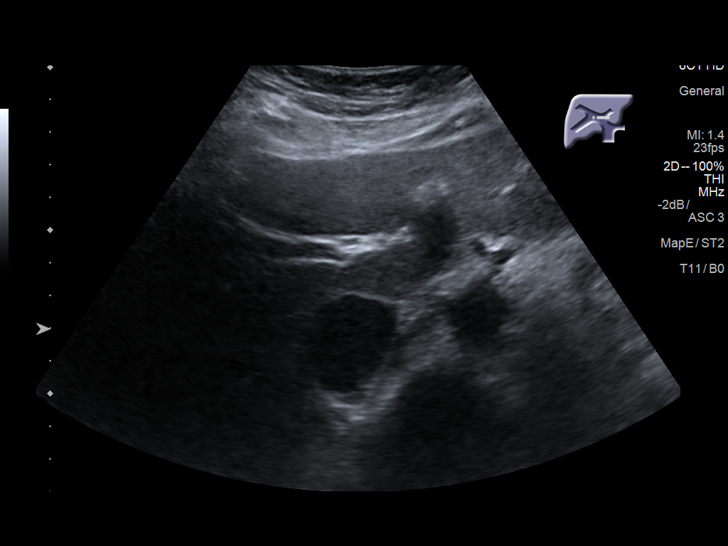
[im 27/43]
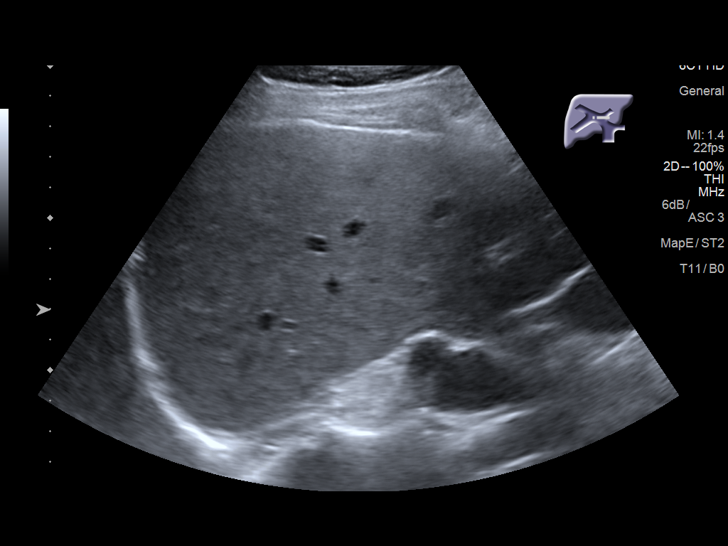
[im 29/43]
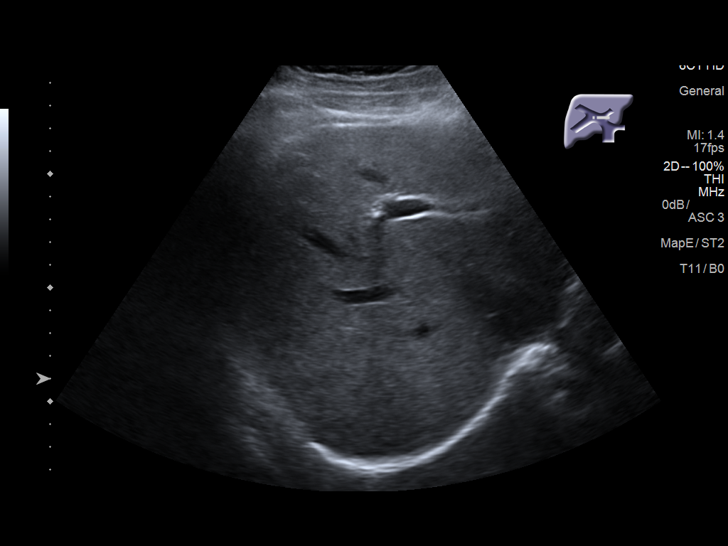
[im 32/43]
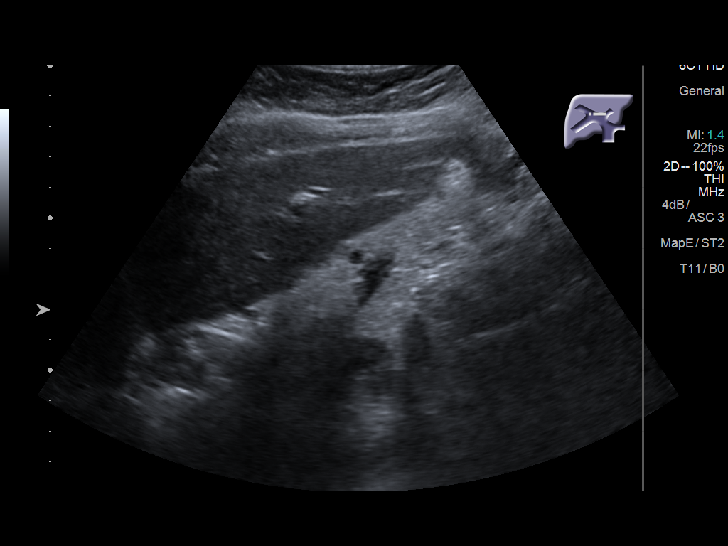
[im 36/43]
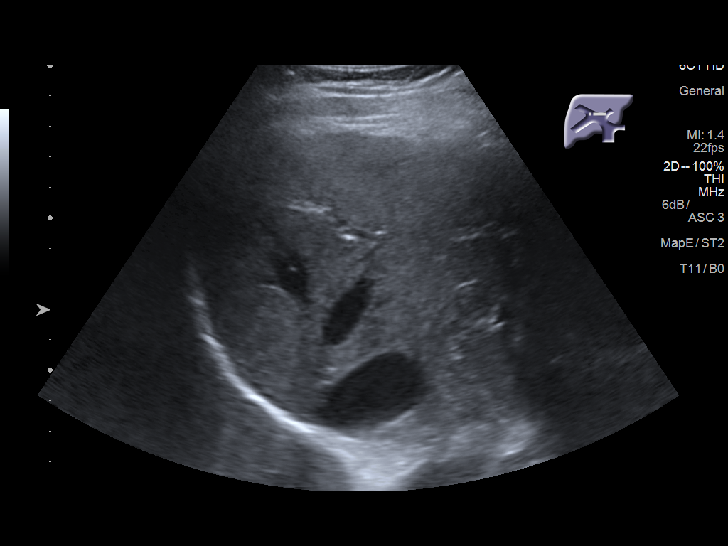
[im 39/43]
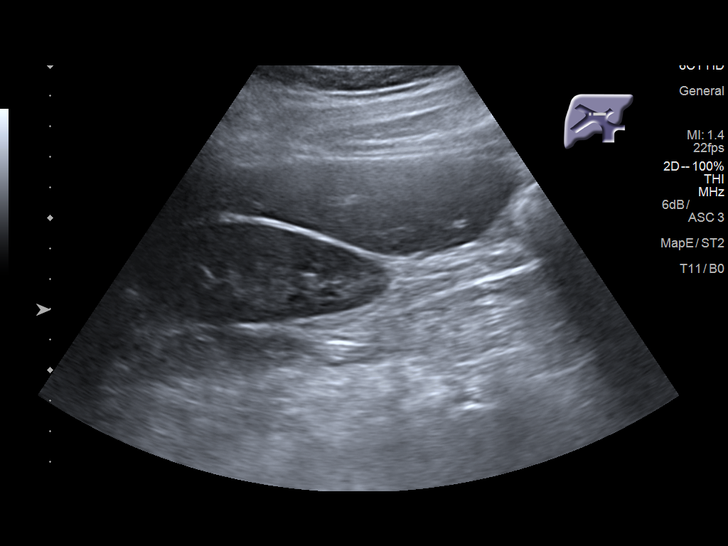
[im 43/43]
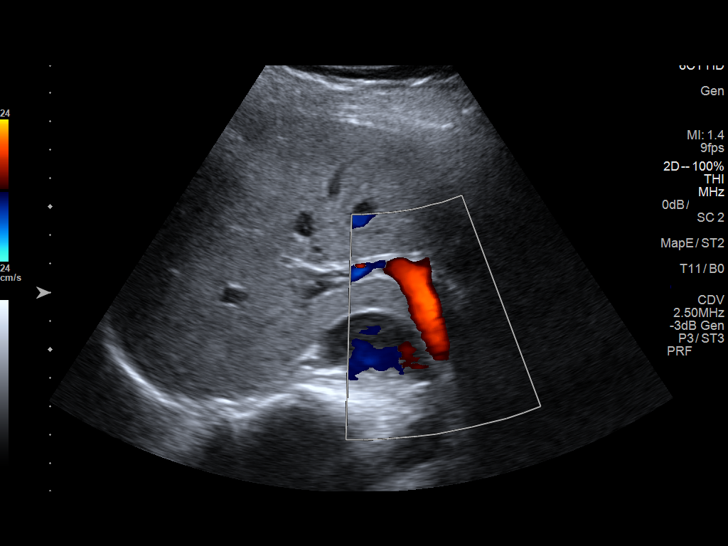

[14 of 25 positions shown; findings below may reference images not displayed]

FINDINGS: Gallbladder:

No gallstones or wall thickening visualized. No sonographic Murphy
sign noted by sonographer.

Common bile duct:

Diameter: 1.6 mm which is within normal limits.

Liver:

No focal lesion identified. Within normal limits in parenchymal
echogenicity. Portal vein is patent on color Doppler imaging with
normal direction of blood flow towards the liver.
IMPRESSION: No abnormality seen in the right upper quadrant of the abdomen.

## 2017-10-23 DIAGNOSIS — H5213 Myopia, bilateral: Secondary | ICD-10-CM | POA: Diagnosis not present

## 2017-10-23 DIAGNOSIS — H10413 Chronic giant papillary conjunctivitis, bilateral: Secondary | ICD-10-CM | POA: Diagnosis not present

## 2017-11-02 DIAGNOSIS — Z1231 Encounter for screening mammogram for malignant neoplasm of breast: Secondary | ICD-10-CM | POA: Diagnosis not present

## 2017-11-02 DIAGNOSIS — Z01419 Encounter for gynecological examination (general) (routine) without abnormal findings: Secondary | ICD-10-CM | POA: Diagnosis not present

## 2017-11-02 DIAGNOSIS — Z6822 Body mass index (BMI) 22.0-22.9, adult: Secondary | ICD-10-CM | POA: Diagnosis not present

## 2018-02-01 NOTE — Progress Notes (Signed)
[facility]  PATIENT: Linda Wolfe DOB: 1976-07-30   REASON FOR VISIT: Follow-up for migraine, PFO HISTORY FROM: Patient    HISTORY OF PRESENT ILLNESS:Linda Wolfe, 42 year old female returns for yearly routine followup.She has  migraines very infrequently she reports 2-3 in the last year.  She has stopped trokendi 50 mg extended release.   She is on Toprol for occasional palpitations, she takes it when necessary about once every 6 months she says.  She also says that she takes it when she has a headache and it does relieve her headaches her triggers are wine  and sleep deprivation She returns for reevaluation.   HISTORY: Linda Wolfe, 42 year old female returns for followup. She was last seen in this office but Dr. Krista Blue 08/05/2013. She has a history of migraine headache, she was previously a patient of Dr. Erling Cruz. 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  During previous severe migraine headaches, she also had few episode of visual alteration, such as tunnel vision, sometimes marching numbness along her left side, occasionally with aphasia  Her headache was previously under good control with Topamax 25 mg twice a day, she has not taking Topamax as preventive medication for a while, until Summer of 2014, she began to have more frequent headaches to the point of couple times a week, few episode with associated tunnel vision, bilateral frontal severe pounding headache with associated light noise  sensitivity, no nauseous, lasting couple hours, relieved by ibuprofen, or Tylenol.  She was seen by her primary care physician, was put on Topamax 25 mg since October 2014, which has been effective, she does complains of sleepiness with morning dose of Topamax, sometimes forgot to take it.  She never tried triptan treatment in the past, she is happy about ibuprofen Tylenol as abortive treatment.  She was placed on trokendi XR by Dr. Krista Blue and is tolerating the medication without side effects. She is pleased with her response. Discussed migraine triggers .She returns for reevaluation   REVIEW OF SYSTEMS: Full 14 system review of systems performed and notable only for those listed, all others are neg:  Constitutional: neg  Cardiovascular: neg Ear/Nose/Throat: neg  Skin: neg Eyes: neg Respiratory: neg Gastroitestinal: neg  Hematology/Lymphatic: neg  Endocrine: neg Musculoskeletal:neg Allergy/Immunology: neg Neurological: neg Psychiatric: neg Sleep : neg   ALLERGIES: Allergies  Allergen Reactions  . Codeine Nausea And Vomiting    HOME MEDICATIONS: Outpatient Medications Prior to Visit  Medication Sig Dispense Refill  . atenolol (TENORMIN) 25 MG tablet Take 25 mg by mouth as needed.    . Bepotastine Besilate (BEPREVE OP) Apply to eye daily as needed.    Marland Kitchen levocetirizine (XYZAL) 5 MG tablet Take 5 mg by mouth every evening.    . Topiramate ER (TROKENDI XR) 50 MG CP24 Take 1 capsule by mouth at bedtime. (Patient not taking: Reported on 02/02/2018) 90 capsule 3   No facility-administered medications prior to visit.     PAST MEDICAL HISTORY: Past Medical History:  Diagnosis Date  . Migraine   . Venous hemangioma 11/2014   roof of mouth (  excised)    PAST SURGICAL HISTORY: Past Surgical History:  Procedure Laterality Date  . CESAREAN SECTION    . CESAREAN SECTION  07/16/2011   Procedure: CESAREAN SECTION;  Surgeon: Margarette Asal;  Location: [facility];  Service: Gynecology;   Laterality: N/A;  Repeat cesarean section with delivery of baby boy at 58. apgars 8/9. Bilateral tubal ligation with filshie clips.  Marland Kitchen excision venous hemangioma  11-2014   mouth  . MOUTH SURGERY    . R shoulder surgery Right 07-2014    FAMILY HISTORY: History reviewed. No pertinent family history.  SOCIAL HISTORY: Social History   Socioeconomic History  . Marital status: Married    Spouse name: Rich  . Number of children: 2  . Years of education: masters  . Highest education level: Not on file  Occupational History  . 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 Financial resource strain: Not on file  . Food insecurity:    Worry: Not on file    Inability: Not on file  . Transportation needs:    Medical: Not on file    Non-medical: Not on file  Tobacco Use  . Smoking status: Never Smoker  . Smokeless tobacco: Never Used  Substance and Sexual Activity  . Alcohol use: Yes    Alcohol/week: 0.5 oz    Types: 1 drink(s) per week    Comment: with dinner  . Drug use: No  . Sexual activity: Not on file  Lifestyle  . Physical activity:    Days per week: Not on file    Minutes per session: Not on file  . Stress: Not on file  Relationships  . Social connections:    Talks on phone: Not on file    Gets together: Not on file    Attends religious service: Not on file    Active member of club or organization: Not on file    Attends meetings of clubs or organizations: Not on file    Relationship status: Not on file  . Intimate partner violence:    Fear of current or ex partner: Not on file    Emotionally abused: Not on file    Physically abused: Not on file    Forced sexual activity: Not on file  Other Topics Concern  . Not on file  Social History Narrative   Patient lives at home with her husband English as a second language teacher)   Education- Masters   Caffeine- one cup    Right handed.     PHYSICAL EXAM  Vitals:   02/02/18 0902  BP: 101/68  Pulse: 77  Weight:  148 lb (67.1 kg)  Height: 5\' 9"  (1.753 m)   Body mass index is 21.86 kg/m. Generalized: Well developed, in no acute distress  Head: normocephalic and atraumatic,. Oropharynx benign  Neck: Supple,   Musculoskeletal: No deformity   Neurological examination   Mentation: Alert oriented to time, place, history taking. Follows all commands speech and language fluent  Cranial nerve II-XII: Pupils were equal round reactive to light extraocular movements were full, visual field were full on confrontational test. Facial sensation and strength were normal. hearing was intact to finger rubbing bilaterally. Uvula tongue midline. head turning and shoulder shrug were normal and symmetric.Tongue protrusion into cheek strength was normal. Motor: normal bulk and tone, full strength in the BUE, BLE, fine finger movements normal, no pronator drift. No focal weakness Sensory: normal and symmetric to light touch,  Coordination: finger-nose-finger, heel-to-shin bilaterally, no dysmetria  Reflexes: Brachioradialis 2/2, biceps 2/2, triceps 2/2, patellar 2/2, Achilles 2/2, plantar responses were flexor bilaterally. Gait and Station: Rising up from seated position without assistance, normal stance, moderate stride, good arm swing, smooth turning, able to perform tiptoe, and heel walking without difficulty. Tandem gait is steady   DIAGNOSTIC DATA (LABS, IMAGING, TESTING) -  ASSESSMENT AND PLAN 42 y.o. year old female  has a past medical history of Migraine and Venous hemangioma (11/2014). here to follow-up. Migraines well-controlled trokendi extended release. She has about one migraine every 3 months and a regular headache monthly   Continue atenolol when necessary for palpitations and migraine Call for increase in headaches Migraine tracker APP to record headaches/migraines Follow-up yearly and when necessary If Headaches recur she can be placed back on Trokendi. I spent 15 min in total face to face time  with the patient more than 50% of which was spent counseling and coordination of care, reviewing test results reviewing medications and discussing and reviewing the diagnosis of migraine and the importance of avoiding triggers,  and further treatment options. , Rayburn Ma, [facility], APRN  [facility] [address] [place], [place] [address] [phone]

## 2018-02-02 ENCOUNTER — Ambulatory Visit: Payer: BLUE CROSS/BLUE SHIELD | Admitting: Nurse Practitioner

## 2018-02-02 ENCOUNTER — Encounter: Payer: Self-pay | Admitting: Nurse Practitioner

## 2018-02-02 ENCOUNTER — Telehealth: Payer: Self-pay | Admitting: Nurse Practitioner

## 2018-02-02 VITALS — BP 101/68 | HR 77 | Ht 69.0 in | Wt 148.0 lb

## 2018-02-02 DIAGNOSIS — R002 Palpitations: Secondary | ICD-10-CM

## 2018-02-02 DIAGNOSIS — G43909 Migraine, unspecified, not intractable, without status migrainosus: Secondary | ICD-10-CM | POA: Diagnosis not present

## 2018-02-02 MED ORDER — ATENOLOL 25 MG PO TABS: 25.0000 mg | ORAL_TABLET | Freq: Every day | ORAL | 3 refills | Status: DC | PRN

## 2018-02-02 MED ORDER — ATENOLOL 25 MG PO TABS: 25.0000 mg | ORAL_TABLET | ORAL | 3 refills | Status: DC | PRN

## 2018-02-02 NOTE — Patient Instructions (Signed)
Continue atenolol when necessary or palpitations Call for increase in headaches Migraine tracker APP to record headaches/migraines Follow-up yearly and when necessary

## 2018-02-02 NOTE — Telephone Encounter (Signed)
Spoke to [place] with HT and she needed clarification on the atenolol.  I relayed that it would be daily prn.  She verbalized understanding.

## 2018-02-02 NOTE — Telephone Encounter (Signed)
Linda Wolfe with [facility] requesting a call back at [phone] to discuss atenolol (TENORMIN) 25 MG tablet please call to advise

## 2018-02-09 NOTE — Progress Notes (Signed)
I have reviewed and agreed above plan. 

## 2018-06-07 DIAGNOSIS — Z23 Encounter for immunization: Secondary | ICD-10-CM | POA: Diagnosis not present

## 2018-09-07 DIAGNOSIS — K148 Other diseases of tongue: Secondary | ICD-10-CM | POA: Diagnosis not present

## 2018-09-07 DIAGNOSIS — R109 Unspecified abdominal pain: Secondary | ICD-10-CM | POA: Diagnosis not present

## 2018-09-07 DIAGNOSIS — F418 Other specified anxiety disorders: Secondary | ICD-10-CM | POA: Diagnosis not present

## 2018-09-07 DIAGNOSIS — R82998 Other abnormal findings in urine: Secondary | ICD-10-CM | POA: Diagnosis not present

## 2018-09-07 DIAGNOSIS — Z1389 Encounter for screening for other disorder: Secondary | ICD-10-CM | POA: Diagnosis not present

## 2018-09-07 DIAGNOSIS — D72819 Decreased white blood cell count, unspecified: Secondary | ICD-10-CM | POA: Diagnosis not present

## 2018-09-07 DIAGNOSIS — Z0000 Encounter for general adult medical examination without abnormal findings: Secondary | ICD-10-CM | POA: Diagnosis not present

## 2018-09-19 DIAGNOSIS — F418 Other specified anxiety disorders: Secondary | ICD-10-CM | POA: Diagnosis not present

## 2019-01-30 ENCOUNTER — Telehealth: Payer: Self-pay | Admitting: *Deleted

## 2019-01-30 NOTE — Telephone Encounter (Signed)
Due to current COVID 19 pandemic, our office is severely reducing in office visits until further notice, in order to minimize the risk to our patients and healthcare providers. Unable to get in contact with the patient to convert their office visit with Judson Roch on 02/04/2019 into a doxy.me visit. I left a voicemail asking the patient to return my call. Office number was provided.     If patient calls back please convert their office visit into a doxy.me visit.

## 2019-01-31 NOTE — Telephone Encounter (Signed)
Noted  

## 2019-01-31 NOTE — Telephone Encounter (Signed)
Pt gives consent to video visit and to [employer]  email- [email or web address]  [email or web address]

## 2019-02-03 NOTE — Progress Notes (Deleted)
    Virtual Visit via Video Note  I connected with Linda Wolfe on 02/03/19 at  9:15 AM EDT by a video enabled telemedicine application and verified that I am speaking with the correct person using two identifiers.  Location: Patient: *** Provider: ***   I discussed the limitations of evaluation and management by telemedicine and the availability of in person appointments. The patient expressed understanding and agreed to proceed.  History of Present Illness: HISTORY: Linda Wolfe, 43 year old female returns for followup. She was last seen in this office but Linda Wolfe 08/05/2013. She has a history of migraine headache, she was previously a patient of Linda Wolfe. 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  During previous severe migraine headaches, she also had few episode of visual alteration, such as tunnel vision, sometimes marching numbness along her left side, occasionally with aphasia  Her headache was previously under good control with Topamax 25 mg twice a day, she has not taking Topamax as preventive medication for a while, until Summer of 2014, she began to have more frequent headaches to the point of couple times a week, few episode with associated tunnel vision, bilateral frontal severe pounding headache with associated light noise sensitivity, no nauseous, lasting couple hours, relieved by ibuprofen, or Tylenol.  She was seen by her primary care physician, was put on Topamax 25 mg since October 2014, which has  been effective, she does complains of sleepiness with morning dose of Topamax, sometimes forgot to take it.  She never tried triptan treatment in the past, she is happy about ibuprofen Tylenol as abortive treatment.  She was placed on trokendi XR by Linda Wolfe and is tolerating the medication without side effects. She is pleased with her response. Discussed migraine triggers .She returns for reevaluation   02/02/2018 CM: Linda Wolfe, 43 year old female returns for yearly routine followup.She has  migraines very infrequently she reports 2-3 in the last year.  She has stopped trokendi 50 mg extended release.   She is on Toprol for occasional palpitations, she takes it when necessary about once every 6 months she says.  She also says that she takes it when she has a headache and it does relieve her headaches her triggers are wine  and sleep deprivation She returns for reevaluation.  Update 02/04/2019 SS:   Observations/Objective:   Assessment and Plan:   Follow Up Instructions:    I discussed the assessment and treatment plan with the patient. The patient was provided an opportunity to ask questions and all were answered. The patient agreed with the plan and demonstrated an understanding of the instructions.   The patient was advised to call back or seek an in-person evaluation if the symptoms worsen or if the condition fails to improve as anticipated.  I provided *** minutes of non-face-to-face time during this encounter.   Suzzanne Cloud, NP

## 2019-02-04 ENCOUNTER — Ambulatory Visit: Payer: BLUE CROSS/BLUE SHIELD | Admitting: Neurology

## 2019-02-04 ENCOUNTER — Ambulatory Visit: Payer: BLUE CROSS/BLUE SHIELD | Admitting: Nurse Practitioner

## 2019-02-04 NOTE — Telephone Encounter (Signed)
Pt called in and stated that she needs to cancel appt due to something coming up at work, she also states she hasnt had any migraines

## 2019-02-04 NOTE — Telephone Encounter (Signed)
Noted  

## 2019-03-14 DIAGNOSIS — R102 Pelvic and perineal pain: Secondary | ICD-10-CM | POA: Diagnosis not present

## 2019-03-14 DIAGNOSIS — R1903 Right lower quadrant abdominal swelling, mass and lump: Secondary | ICD-10-CM | POA: Diagnosis not present

## 2019-03-14 DIAGNOSIS — N800 Endometriosis of uterus: Secondary | ICD-10-CM | POA: Diagnosis not present

## 2019-03-15 DIAGNOSIS — Z30013 Encounter for initial prescription of injectable contraceptive: Secondary | ICD-10-CM | POA: Diagnosis not present

## 2019-06-07 DIAGNOSIS — Z23 Encounter for immunization: Secondary | ICD-10-CM | POA: Diagnosis not present

## 2019-06-11 DIAGNOSIS — Z01419 Encounter for gynecological examination (general) (routine) without abnormal findings: Secondary | ICD-10-CM | POA: Diagnosis not present

## 2019-06-11 DIAGNOSIS — Z6823 Body mass index (BMI) 23.0-23.9, adult: Secondary | ICD-10-CM | POA: Diagnosis not present

## 2019-06-11 DIAGNOSIS — Z1231 Encounter for screening mammogram for malignant neoplasm of breast: Secondary | ICD-10-CM | POA: Diagnosis not present

## 2019-06-12 DIAGNOSIS — Z3042 Encounter for surveillance of injectable contraceptive: Secondary | ICD-10-CM | POA: Diagnosis not present

## 2019-08-29 DIAGNOSIS — Z3042 Encounter for surveillance of injectable contraceptive: Secondary | ICD-10-CM | POA: Diagnosis not present

## 2019-09-11 DIAGNOSIS — H5213 Myopia, bilateral: Secondary | ICD-10-CM | POA: Diagnosis not present

## 2019-09-11 DIAGNOSIS — H10413 Chronic giant papillary conjunctivitis, bilateral: Secondary | ICD-10-CM | POA: Diagnosis not present

## 2019-09-13 DIAGNOSIS — Z0000 Encounter for general adult medical examination without abnormal findings: Secondary | ICD-10-CM | POA: Diagnosis not present

## 2019-09-13 DIAGNOSIS — R102 Pelvic and perineal pain: Secondary | ICD-10-CM | POA: Diagnosis not present

## 2019-09-13 DIAGNOSIS — G43909 Migraine, unspecified, not intractable, without status migrainosus: Secondary | ICD-10-CM | POA: Diagnosis not present

## 2019-09-13 DIAGNOSIS — D72819 Decreased white blood cell count, unspecified: Secondary | ICD-10-CM | POA: Diagnosis not present

## 2019-09-13 DIAGNOSIS — Z1331 Encounter for screening for depression: Secondary | ICD-10-CM | POA: Diagnosis not present

## 2019-09-13 DIAGNOSIS — K148 Other diseases of tongue: Secondary | ICD-10-CM | POA: Diagnosis not present

## 2019-09-13 DIAGNOSIS — R7989 Other specified abnormal findings of blood chemistry: Secondary | ICD-10-CM | POA: Diagnosis not present

## 2019-11-13 DIAGNOSIS — H1045 Other chronic allergic conjunctivitis: Secondary | ICD-10-CM | POA: Diagnosis not present

## 2019-11-13 DIAGNOSIS — J3089 Other allergic rhinitis: Secondary | ICD-10-CM | POA: Diagnosis not present

## 2019-11-13 DIAGNOSIS — J301 Allergic rhinitis due to pollen: Secondary | ICD-10-CM | POA: Diagnosis not present

## 2019-11-14 DIAGNOSIS — Z3042 Encounter for surveillance of injectable contraceptive: Secondary | ICD-10-CM | POA: Diagnosis not present

## 2020-05-18 DIAGNOSIS — M542 Cervicalgia: Secondary | ICD-10-CM | POA: Diagnosis not present

## 2020-06-03 DIAGNOSIS — Z23 Encounter for immunization: Secondary | ICD-10-CM | POA: Diagnosis not present

## 2020-06-11 DIAGNOSIS — Z6823 Body mass index (BMI) 23.0-23.9, adult: Secondary | ICD-10-CM | POA: Diagnosis not present

## 2020-06-11 DIAGNOSIS — Z1231 Encounter for screening mammogram for malignant neoplasm of breast: Secondary | ICD-10-CM | POA: Diagnosis not present

## 2020-06-11 DIAGNOSIS — Z20822 Contact with and (suspected) exposure to covid-19: Secondary | ICD-10-CM | POA: Diagnosis not present

## 2020-06-11 DIAGNOSIS — Z01419 Encounter for gynecological examination (general) (routine) without abnormal findings: Secondary | ICD-10-CM | POA: Diagnosis not present

## 2020-09-11 DIAGNOSIS — H5213 Myopia, bilateral: Secondary | ICD-10-CM | POA: Diagnosis not present

## 2020-09-11 DIAGNOSIS — H10413 Chronic giant papillary conjunctivitis, bilateral: Secondary | ICD-10-CM | POA: Diagnosis not present

## 2020-09-23 DIAGNOSIS — Z1152 Encounter for screening for COVID-19: Secondary | ICD-10-CM | POA: Diagnosis not present

## 2020-09-23 DIAGNOSIS — Z1159 Encounter for screening for other viral diseases: Secondary | ICD-10-CM | POA: Diagnosis not present

## 2020-11-17 DIAGNOSIS — J3089 Other allergic rhinitis: Secondary | ICD-10-CM | POA: Diagnosis not present

## 2020-11-17 DIAGNOSIS — H1045 Other chronic allergic conjunctivitis: Secondary | ICD-10-CM | POA: Diagnosis not present

## 2020-11-17 DIAGNOSIS — J301 Allergic rhinitis due to pollen: Secondary | ICD-10-CM | POA: Diagnosis not present

## 2021-08-12 DIAGNOSIS — Z23 Encounter for immunization: Secondary | ICD-10-CM | POA: Diagnosis not present

## 2021-08-12 DIAGNOSIS — L821 Other seborrheic keratosis: Secondary | ICD-10-CM | POA: Diagnosis not present

## 2021-08-17 DIAGNOSIS — Z01419 Encounter for gynecological examination (general) (routine) without abnormal findings: Secondary | ICD-10-CM | POA: Diagnosis not present

## 2021-08-17 DIAGNOSIS — Z6824 Body mass index (BMI) 24.0-24.9, adult: Secondary | ICD-10-CM | POA: Diagnosis not present

## 2021-08-17 DIAGNOSIS — R319 Hematuria, unspecified: Secondary | ICD-10-CM | POA: Diagnosis not present

## 2021-08-17 DIAGNOSIS — R102 Pelvic and perineal pain: Secondary | ICD-10-CM | POA: Diagnosis not present

## 2021-08-24 DIAGNOSIS — Z1231 Encounter for screening mammogram for malignant neoplasm of breast: Secondary | ICD-10-CM | POA: Diagnosis not present

## 2021-09-22 DIAGNOSIS — H10413 Chronic giant papillary conjunctivitis, bilateral: Secondary | ICD-10-CM | POA: Diagnosis not present

## 2021-09-22 DIAGNOSIS — H5213 Myopia, bilateral: Secondary | ICD-10-CM | POA: Diagnosis not present

## 2021-11-17 DIAGNOSIS — J3089 Other allergic rhinitis: Secondary | ICD-10-CM | POA: Diagnosis not present

## 2021-11-17 DIAGNOSIS — J301 Allergic rhinitis due to pollen: Secondary | ICD-10-CM | POA: Diagnosis not present

## 2021-11-17 DIAGNOSIS — H1045 Other chronic allergic conjunctivitis: Secondary | ICD-10-CM | POA: Diagnosis not present

## 2022-09-28 ENCOUNTER — Other Ambulatory Visit: Payer: Self-pay | Admitting: Obstetrics and Gynecology

## 2022-09-28 ENCOUNTER — Encounter: Payer: Self-pay | Admitting: Gastroenterology

## 2022-09-28 DIAGNOSIS — R928 Other abnormal and inconclusive findings on diagnostic imaging of breast: Secondary | ICD-10-CM

## 2022-10-05 ENCOUNTER — Ambulatory Visit
Admission: RE | Admit: 2022-10-05 | Discharge: 2022-10-05 | Disposition: A | Discharge status: Home / Self Care | Payer: BLUE CROSS/BLUE SHIELD | Source: Ambulatory Visit | Attending: Obstetrics and Gynecology | Admitting: Obstetrics and Gynecology

## 2022-10-05 ENCOUNTER — Ambulatory Visit
Admission: RE | Admit: 2022-10-05 | Discharge: 2022-10-05 | Disposition: A | Discharge status: Home / Self Care | Payer: BC Managed Care – PPO | Source: Ambulatory Visit | Attending: Obstetrics and Gynecology | Admitting: Obstetrics and Gynecology

## 2022-10-05 DIAGNOSIS — R928 Other abnormal and inconclusive findings on diagnostic imaging of breast: Secondary | ICD-10-CM

## 2022-10-21 ENCOUNTER — Ambulatory Visit (AMBULATORY_SURGERY_CENTER): Payer: BC Managed Care – PPO

## 2022-10-21 ENCOUNTER — Other Ambulatory Visit: Payer: Self-pay

## 2022-10-21 VITALS — Ht 69.0 in | Wt 155.0 lb

## 2022-10-21 DIAGNOSIS — Z1211 Encounter for screening for malignant neoplasm of colon: Secondary | ICD-10-CM

## 2022-10-21 MED ORDER — NA SULFATE-K SULFATE-MG SULF 17.5-3.13-1.6 GM/177ML PO SOLN: 1.0000 | Freq: Once | ORAL | 0 refills | Status: AC

## 2022-10-21 NOTE — Progress Notes (Signed)
Denies allergies to eggs or soy products. Denies complication of anesthesia or sedation. Denies use of weight loss medication. Denies use of O2.   Emmi instructions given for colonoscopy.  

## 2022-11-02 ENCOUNTER — Encounter: Payer: Self-pay | Admitting: Gastroenterology

## 2022-11-11 ENCOUNTER — Encounter: Payer: BLUE CROSS/BLUE SHIELD | Admitting: Gastroenterology

## 2022-12-25 ENCOUNTER — Encounter: Payer: Self-pay | Admitting: Certified Registered Nurse Anesthetist

## 2022-12-26 ENCOUNTER — Encounter: Payer: Self-pay | Admitting: Gastroenterology

## 2022-12-26 ENCOUNTER — Ambulatory Visit: Payer: BC Managed Care – PPO | Admitting: Gastroenterology

## 2022-12-26 VITALS — BP 123/78 | HR 65 | Temp 97.3°F | Resp 16 | Ht 69.0 in | Wt 155.0 lb

## 2022-12-26 DIAGNOSIS — Z1211 Encounter for screening for malignant neoplasm of colon: Secondary | ICD-10-CM

## 2022-12-26 MED ORDER — SODIUM CHLORIDE 0.9 % IV SOLN: 500.0000 mL | Freq: Once | INTRAVENOUS | Status: DC

## 2022-12-26 NOTE — Patient Instructions (Signed)
Please read handouts provided. Continue present medications. Repeat colonoscopy in 10 years for screening.   YOU HAD AN ENDOSCOPIC PROCEDURE TODAY AT THE  ENDOSCOPY CENTER:   Refer to the procedure report that was given to you for any specific questions about what was found during the examination.  If the procedure report does not answer your questions, please call your gastroenterologist to clarify.  If you requested that your care partner not be given the details of your procedure findings, then the procedure report has been included in a sealed envelope for you to review at your convenience later.  YOU SHOULD EXPECT: Some feelings of bloating in the abdomen. Passage of more gas than usual.  Walking can help get rid of the air that was put into your GI tract during the procedure and reduce the bloating. If you had a lower endoscopy (such as a colonoscopy or flexible sigmoidoscopy) you may notice spotting of blood in your stool or on the toilet paper. If you underwent a bowel prep for your procedure, you may not have a normal bowel movement for a few days.  Please Note:  You might notice some irritation and congestion in your nose or some drainage.  This is from the oxygen used during your procedure.  There is no need for concern and it should clear up in a day or so.  SYMPTOMS TO REPORT IMMEDIATELY:  Following lower endoscopy (colonoscopy or flexible sigmoidoscopy):  Excessive amounts of blood in the stool  Significant tenderness or worsening of abdominal pains  Swelling of the abdomen that is new, acute  Fever of 100F or higher.  For urgent or emergent issues, a gastroenterologist can be reached at any hour by calling [phone]. Do not use MyChart messaging for urgent concerns.    DIET:  We do recommend a small meal at first, but then you may proceed to your regular diet.  Drink plenty of fluids but you should avoid alcoholic beverages for 24 hours.  ACTIVITY:  You should  plan to take it easy for the rest of today and you should NOT DRIVE or use heavy machinery until tomorrow (because of the sedation medicines used during the test).    FOLLOW UP: Our staff will call the number listed on your records the next business day following your procedure.  We will call around 7:15- 8:00 am to check on you and address any questions or concerns that you may have regarding the information given to you following your procedure. If we do not reach you, we will leave a message.     If any biopsies were taken you will be contacted by phone or by letter within the next 1-3 weeks.  Please call us at [phone] if you have not heard about the biopsies in 3 weeks.    SIGNATURES/CONFIDENTIALITY: You and/or your care partner have signed paperwork which will be entered into your electronic medical record.  These signatures attest to the fact that that the information above on your After Visit Summary has been reviewed and is understood.  Full responsibility of the confidentiality of this discharge information lies with you and/or your care-partner. 

## 2022-12-26 NOTE — Progress Notes (Signed)
Pt's states no medical or surgical changes since previsit or office visit. 

## 2022-12-26 NOTE — Progress Notes (Signed)
History and Physical:  This patient presents for endoscopic testing for: Encounter Diagnosis  Name Primary?   Special screening for malignant neoplasms, colon Yes    Average risk for colorectal cancer.  First screening exam. Patient denies chronic abdominal pain, rectal bleeding, constipation or diarrhea.    Patient is otherwise without complaints or active issues today.   Past Medical History: Past Medical History:  Diagnosis Date   Allergy    Migraine    PFO (patent foramen ovale)    hx of   Venous hemangioma 11/28/2014   roof of mouth (excised)     Past Surgical History: Past Surgical History:  Procedure Laterality Date   CESAREAN SECTION     CESAREAN SECTION  07/16/2011   Procedure: CESAREAN SECTION;  Surgeon: Meriel Pica;  Location: [facility];  Service: Gynecology;  Laterality: N/A;  Repeat cesarean section with delivery of baby boy at 65. apgars 8/9. Bilateral tubal ligation with filshie clips.   excision venous hemangioma  11-2014   mouth   MOUTH SURGERY     R shoulder surgery Right 07-2014    Allergies: Allergies  Allergen Reactions   Codeine Nausea And Vomiting    Outpatient Meds: Current Outpatient Medications  Medication Sig Dispense Refill   levocetirizine (XYZAL) 5 MG tablet Take 5 mg by mouth every evening.     norethindrone-ethinyl estradiol-FE (LOESTRIN FE) 1-20 MG-MCG tablet Take 1 tablet by mouth daily.     Bepotastine Besilate (BEPREVE OP) Apply to eye daily as needed.     Current Facility-Administered Medications  Medication Dose Route Frequency Provider Last Rate Last Admin   0.9 %  sodium chloride infusion  500 mL Intravenous Once Sherrilyn Rist, MD          ___________________________________________________________________ Objective   Exam:  BP 124/73   Pulse 74   Temp (!) 97.3 F (36.3 C)   Resp 12   Ht 5\' 9"  (1.753 m)   Wt 155 lb (70.3 kg)   SpO2 99%   BMI 22.89 kg/m   CV: regular , S1/S2 Resp: clear to  auscultation bilaterally, normal RR and effort noted GI: soft, no tenderness, with active bowel sounds.   Assessment: Encounter Diagnosis  Name Primary?   Special screening for malignant neoplasms, colon Yes     Plan: Colonoscopy  The benefits and risks of the planned procedure were described in detail with the patient or (when appropriate) their health care proxy.  Risks were outlined as including, but not limited to, bleeding, infection, perforation, adverse medication reaction leading to cardiac or pulmonary decompensation, pancreatitis (if ERCP).  The limitation of incomplete mucosal visualization was also discussed.  No guarantees or warranties were given.    The patient is appropriate for an endoscopic procedure in the ambulatory setting.   - Amada Jupiter, MD

## 2022-12-26 NOTE — Op Note (Signed)
[facility] Patient Name: Linda Wolfe Procedure Date: 12/26/2022 9:34 AM MRN: [id number] Endoscopist: Sherilyn Cooter L. Myrtie Neither , MD, [id number] Age: 47 Referring MD:  Date of Birth: Jul 01, 1976 Gender: Female Account #: [id number] Procedure:                Colonoscopy Indications:              Screening for colorectal malignant neoplasm, This                            is the patient's first colonoscopy Medicines:                Monitored Anesthesia Care Procedure:                Pre-Anesthesia Assessment:                           - Prior to the procedure, a History and Physical                            was performed, and patient medications and                            allergies were reviewed. The patient's tolerance of                            previous anesthesia was also reviewed. The risks                            and benefits of the procedure and the sedation                            options and risks were discussed with the patient.                            All questions were answered, and informed consent                            was obtained. Prior Anticoagulants: The patient has                            taken no anticoagulant or antiplatelet agents. ASA                            Grade Assessment: II - A patient with mild systemic                            disease. After reviewing the risks and benefits,                            the patient was deemed in satisfactory condition to                            undergo the procedure.  After obtaining informed consent, the colonoscope                            was passed under direct vision. Throughout the                            procedure, the patient's blood pressure, pulse, and                            oxygen saturations were monitored continuously. The                            Olympus CF-HQ190L [id number]) Colonoscope was                            introduced through the  anus and advanced to the the                            terminal ileum, with identification of the                            appendiceal orifice and IC valve. The colonoscopy                            was performed without difficulty. The patient                            tolerated the procedure well. The quality of the                            bowel preparation was excellent. The ileocecal                            valve, appendiceal orifice, and rectum were                            photographed. Scope In: 9:39:00 AM Scope Out: 9:50:08 AM Scope Withdrawal Time: 0 hours 8 minutes 8 seconds  Total Procedure Duration: 0 hours 11 minutes 8 seconds  Findings:                 The perianal and digital rectal examinations were                            normal.                           The terminal ileum appeared normal.                           Repeat examination of right colon under NBI                            performed.  The entire examined colon appeared normal on direct                            and retroflexion views. Complications:            No immediate complications. Estimated Blood Loss:     Estimated blood loss: none. Impression:               - The examined portion of the ileum was normal.                           - The entire examined colon is normal on direct and                            retroflexion views.                           - No specimens collected. Recommendation:           - Patient has a contact number available for                            emergencies. The signs and symptoms of potential                            delayed complications were discussed with the                            patient. Return to normal activities tomorrow.                            Written discharge instructions were provided to the                            patient.                           - Resume previous diet.                           -  Continue present medications.                           - Repeat colonoscopy in 10 years for screening                            purposes. Gianno Volner L. Myrtie Neither, MD 12/26/2022 9:53:56 AM This report has been signed electronically.

## 2022-12-26 NOTE — Progress Notes (Signed)
Report given to PACU, vss 

## 2022-12-27 ENCOUNTER — Telehealth: Payer: Self-pay

## 2022-12-27 NOTE — Telephone Encounter (Signed)
  Follow up Call-     12/26/2022    8:49 AM  Call back number  Post procedure Call Back phone  # [phone]  Permission to leave phone message Yes     Patient questions:  Do you have a fever, pain , or abdominal swelling? No. Pain Score  0 *  Have you tolerated food without any problems? Yes.    Have you been able to return to your normal activities? Yes.    Do you have any questions about your discharge instructions: Diet   No. Medications  No. Follow up visit  No.  Do you have questions or concerns about your Care? No.  Actions: * If pain score is 4 or above: No action needed, pain <4.
